# Patient Record
Sex: Female | Born: 1980 | Race: Black or African American | Hispanic: No | Marital: Married | Smoking: Current every day smoker
Health system: Southern US, Community
[De-identification: ages and names within clinical notes are randomized; demographics above are authoritative.]

## PROBLEM LIST (undated history)

## (undated) DIAGNOSIS — I10 Essential (primary) hypertension: Secondary | ICD-10-CM

## (undated) DIAGNOSIS — N83209 Unspecified ovarian cyst, unspecified side: Secondary | ICD-10-CM

## (undated) HISTORY — PX: TUBAL LIGATION: SHX77

---

## 2009-06-11 ENCOUNTER — Emergency Department (HOSPITAL_BASED_OUTPATIENT_CLINIC_OR_DEPARTMENT_OTHER): Admission: EM | Admit: 2009-06-11 | Discharge: 2009-06-11 | Payer: Self-pay | Admitting: Emergency Medicine

## 2009-10-13 ENCOUNTER — Ambulatory Visit: Payer: Self-pay | Admitting: Diagnostic Radiology

## 2009-10-13 ENCOUNTER — Emergency Department (HOSPITAL_BASED_OUTPATIENT_CLINIC_OR_DEPARTMENT_OTHER): Admission: EM | Admit: 2009-10-13 | Discharge: 2009-10-13 | Payer: Self-pay | Admitting: Emergency Medicine

## 2009-10-30 ENCOUNTER — Emergency Department (HOSPITAL_BASED_OUTPATIENT_CLINIC_OR_DEPARTMENT_OTHER): Admission: EM | Admit: 2009-10-30 | Discharge: 2009-10-30 | Payer: Self-pay | Admitting: Emergency Medicine

## 2009-10-30 ENCOUNTER — Ambulatory Visit: Payer: Self-pay | Admitting: Diagnostic Radiology

## 2010-04-11 LAB — URINALYSIS, ROUTINE W REFLEX MICROSCOPIC
Glucose, UA: NEGATIVE mg/dL
Hgb urine dipstick: NEGATIVE
Ketones, ur: NEGATIVE mg/dL
Nitrite: NEGATIVE
Protein, ur: NEGATIVE mg/dL
Specific Gravity, Urine: 1.031 — ABNORMAL HIGH (ref 1.005–1.030)
Urobilinogen, UA: 1 mg/dL (ref 0.0–1.0)
pH: 5.5 (ref 5.0–8.0)

## 2010-04-11 LAB — URINE MICROSCOPIC-ADD ON

## 2010-04-11 LAB — WET PREP, GENITAL: Yeast Wet Prep HPF POC: NONE SEEN

## 2010-04-11 LAB — PREGNANCY, URINE: Preg Test, Ur: NEGATIVE

## 2010-04-11 LAB — GC/CHLAMYDIA PROBE AMP, GENITAL
Chlamydia, DNA Probe: POSITIVE — AB
GC Probe Amp, Genital: NEGATIVE

## 2011-11-07 ENCOUNTER — Encounter (HOSPITAL_BASED_OUTPATIENT_CLINIC_OR_DEPARTMENT_OTHER): Payer: Self-pay | Admitting: *Deleted

## 2011-11-07 ENCOUNTER — Emergency Department (HOSPITAL_BASED_OUTPATIENT_CLINIC_OR_DEPARTMENT_OTHER)
Admission: EM | Admit: 2011-11-07 | Discharge: 2011-11-08 | Disposition: A | Discharge status: Home / Self Care | Payer: Self-pay | Attending: Emergency Medicine | Admitting: Emergency Medicine

## 2011-11-07 DIAGNOSIS — L259 Unspecified contact dermatitis, unspecified cause: Secondary | ICD-10-CM | POA: Insufficient documentation

## 2011-11-07 DIAGNOSIS — F17200 Nicotine dependence, unspecified, uncomplicated: Secondary | ICD-10-CM | POA: Insufficient documentation

## 2011-11-07 DIAGNOSIS — I10 Essential (primary) hypertension: Secondary | ICD-10-CM | POA: Insufficient documentation

## 2011-11-07 DIAGNOSIS — L0100 Impetigo, unspecified: Secondary | ICD-10-CM | POA: Insufficient documentation

## 2011-11-07 HISTORY — DX: Essential (primary) hypertension: I10

## 2011-11-07 MED ORDER — CEPHALEXIN 500 MG PO CAPS: 500.0000 mg | ORAL_CAPSULE | Freq: Four times a day (QID) | ORAL | Status: DC

## 2011-11-07 MED ORDER — PREDNISONE 20 MG PO TABS: 40.0000 mg | ORAL_TABLET | Freq: Every day | ORAL | Status: DC

## 2011-11-07 NOTE — ED Provider Notes (Signed)
History     CSN: [id number]  Arrival date & time 11/07/11  2231   First MD Initiated Contact with Patient 11/07/11 2318      Chief Complaint  Patient presents with  . Rash    (Consider location/radiation/quality/duration/timing/severity/associated sxs/prior treatment) Patient is a 31 y.o. female presenting with rash. The history is provided by the patient.  Rash  This is a new problem. Episode onset: 2 and half weeks ago. The problem has been gradually worsening. The problem is associated with chemical exposure (She had used of relaxer on her hair and an African pride hair dye). There has been no fever. The rash is present on the scalp, face, right ear and neck (chest). The pain is at a severity of 2/10. The pain is mild. The pain has been worsening since onset. Associated symptoms include itching, pain and weeping. She has tried antihistamines and steriods (took steroids last week and did not help and now worse) for the symptoms. The treatment provided no relief.    Past Medical History  Diagnosis Date  . Hypertension     History reviewed. No pertinent past surgical history.  No family history on file.  History  Substance Use Topics  . Smoking status: Current Every Day Smoker -- 0.5 packs/day    Types: Cigarettes  . Smokeless tobacco: Not on file  . Alcohol Use: Yes    OB History    Grav Para Term Preterm Abortions TAB SAB Ect Mult Living                  Review of Systems  Constitutional: Negative for fever.  Skin: Positive for itching and rash.  All other systems reviewed and are negative.    Allergies  Review of patient's allergies indicates no known allergies.  Home Medications  No current outpatient prescriptions on file.  BP 146/85  Pulse 88  Temp 98.3 F (36.8 C) (Oral)  Resp 20  SpO2 100%  Physical Exam  Nursing note and vitals reviewed. Constitutional: She is oriented to person, place, and time.  HENT:  Head: Normocephalic.    Right  Ear: There is drainage and swelling.  Ears:  Mouth/Throat: Oropharynx is clear and moist.       All marked areas with honey crusting, weeping, macular/papular rash  Eyes: EOM are normal. Pupils are equal, round, and reactive to light.  Neurological: She is alert and oriented to person, place, and time.  Skin: Rash noted. Rash is maculopapular and pustular. There is erythema.  Psychiatric: She has a normal mood and affect. Her behavior is normal.    ED Course  Procedures (including critical care time)  Labs Reviewed - No data to display No results found.   1. Contact dermatitis   2. Impetigo       MDM   Patient with a rash that appears to be a contact dermatitis most likely from her relax her or dye product she placed in her here 3 weeks ago. Now has become secondarily infected with some type of staph or strep species. With honeycombing crusting over the years, the back of the neck and the side of her face. He should stay she was seen last week for this and was given steroids but her symptoms did not improve but of only gotten worse. She was placed on Keflex and prednisone. She was encouraged to not use any other product on her hair until all her symptoms had resolved.        Avira Tillison  Anitra Lauth, MD 11/08/11 1610

## 2011-11-07 NOTE — ED Notes (Signed)
Rash on her neck.  States she has had it for 2 weeks. She was treated at [facility] for contact dermatitis a week ago with Prednisone and Atarax. Now the rash is draining.

## 2013-03-06 ENCOUNTER — Emergency Department (HOSPITAL_BASED_OUTPATIENT_CLINIC_OR_DEPARTMENT_OTHER): Payer: Self-pay

## 2013-03-06 ENCOUNTER — Emergency Department (HOSPITAL_BASED_OUTPATIENT_CLINIC_OR_DEPARTMENT_OTHER)
Admission: EM | Admit: 2013-03-06 | Discharge: 2013-03-06 | Disposition: A | Discharge status: Home / Self Care | Payer: Self-pay | Attending: Emergency Medicine | Admitting: Emergency Medicine

## 2013-03-06 ENCOUNTER — Encounter (HOSPITAL_BASED_OUTPATIENT_CLINIC_OR_DEPARTMENT_OTHER): Payer: Self-pay | Admitting: Emergency Medicine

## 2013-03-06 DIAGNOSIS — N390 Urinary tract infection, site not specified: Secondary | ICD-10-CM | POA: Diagnosis present

## 2013-03-06 DIAGNOSIS — I10 Essential (primary) hypertension: Secondary | ICD-10-CM | POA: Insufficient documentation

## 2013-03-06 DIAGNOSIS — R112 Nausea with vomiting, unspecified: Secondary | ICD-10-CM | POA: Insufficient documentation

## 2013-03-06 DIAGNOSIS — N83202 Unspecified ovarian cyst, left side: Secondary | ICD-10-CM | POA: Diagnosis present

## 2013-03-06 DIAGNOSIS — A599 Trichomoniasis, unspecified: Secondary | ICD-10-CM | POA: Diagnosis present

## 2013-03-06 DIAGNOSIS — Z79899 Other long term (current) drug therapy: Secondary | ICD-10-CM | POA: Insufficient documentation

## 2013-03-06 DIAGNOSIS — F17200 Nicotine dependence, unspecified, uncomplicated: Secondary | ICD-10-CM | POA: Insufficient documentation

## 2013-03-06 DIAGNOSIS — IMO0002 Reserved for concepts with insufficient information to code with codable children: Secondary | ICD-10-CM | POA: Insufficient documentation

## 2013-03-06 DIAGNOSIS — Z3202 Encounter for pregnancy test, result negative: Secondary | ICD-10-CM | POA: Insufficient documentation

## 2013-03-06 DIAGNOSIS — N83209 Unspecified ovarian cyst, unspecified side: Secondary | ICD-10-CM | POA: Insufficient documentation

## 2013-03-06 LAB — CBC WITH DIFFERENTIAL/PLATELET
Basophils Absolute: 0 10*3/uL (ref 0.0–0.1)
Basophils Relative: 0 % (ref 0–1)
Eosinophils Absolute: 0.2 10*3/uL (ref 0.0–0.7)
Eosinophils Relative: 4 % (ref 0–5)
HCT: 41.6 % (ref 36.0–46.0)
Hemoglobin: 13.8 g/dL (ref 12.0–15.0)
Lymphocytes Relative: 38 % (ref 12–46)
Lymphs Abs: 2 10*3/uL (ref 0.7–4.0)
MCH: 30 pg (ref 26.0–34.0)
MCHC: 33.2 g/dL (ref 30.0–36.0)
MCV: 90.4 fL (ref 78.0–100.0)
Monocytes Absolute: 0.5 10*3/uL (ref 0.1–1.0)
Monocytes Relative: 9 % (ref 3–12)
Neutro Abs: 2.6 10*3/uL (ref 1.7–7.7)
Neutrophils Relative %: 49 % (ref 43–77)
Platelets: 241 10*3/uL (ref 150–400)
RBC: 4.6 MIL/uL (ref 3.87–5.11)
RDW: 13.5 % (ref 11.5–15.5)
WBC: 5.3 10*3/uL (ref 4.0–10.5)

## 2013-03-06 LAB — COMPREHENSIVE METABOLIC PANEL
ALT: 14 U/L (ref 0–35)
AST: 17 U/L (ref 0–37)
Albumin: 3.7 g/dL (ref 3.5–5.2)
Alkaline Phosphatase: 53 U/L (ref 39–117)
BUN: 9 mg/dL (ref 6–23)
CO2: 29 mEq/L (ref 19–32)
Calcium: 9.4 mg/dL (ref 8.4–10.5)
Chloride: 103 mEq/L (ref 96–112)
Creatinine, Ser: 0.7 mg/dL (ref 0.50–1.10)
GFR calc Af Amer: >90 mL/min (ref >90)
GFR calc non Af Amer: >90 mL/min (ref >90)
Glucose, Bld: 86 mg/dL (ref 70–99)
Potassium: 4.2 mEq/L (ref 3.7–5.3)
Sodium: 143 mEq/L (ref 137–147)
Total Bilirubin: <0.2 mg/dL — ABNORMAL LOW (ref 0.3–1.2)
Total Protein: 7.2 g/dL (ref 6.0–8.3)

## 2013-03-06 LAB — URINALYSIS, ROUTINE W REFLEX MICROSCOPIC
Bilirubin Urine: NEGATIVE
Glucose, UA: NEGATIVE mg/dL
Hgb urine dipstick: NEGATIVE
Ketones, ur: NEGATIVE mg/dL
Nitrite: NEGATIVE
Protein, ur: NEGATIVE mg/dL
Specific Gravity, Urine: 1.017 (ref 1.005–1.030)
Urobilinogen, UA: 0.2 mg/dL (ref 0.0–1.0)
pH: 8 (ref 5.0–8.0)

## 2013-03-06 LAB — WET PREP, GENITAL: Yeast Wet Prep HPF POC: NONE SEEN

## 2013-03-06 LAB — URINE MICROSCOPIC-ADD ON

## 2013-03-06 LAB — PREGNANCY, URINE: Preg Test, Ur: NEGATIVE

## 2013-03-06 MED ORDER — CEFTRIAXONE SODIUM 250 MG IJ SOLR
250.0000 mg | Freq: Once | INTRAMUSCULAR | Status: AC
  Administered 2013-03-06: 250 mg via INTRAMUSCULAR
  Filled 2013-03-06: qty 250

## 2013-03-06 MED ORDER — AZITHROMYCIN 250 MG PO TABS
1000.0000 mg | ORAL_TABLET | Freq: Once | ORAL | Status: AC
  Administered 2013-03-06: 1000 mg via ORAL
  Filled 2013-03-06: qty 4

## 2013-03-06 MED ORDER — OXYCODONE-ACETAMINOPHEN 5-325 MG PO TABS: 1.0000 | ORAL_TABLET | Freq: Four times a day (QID) | ORAL | Status: DC | PRN

## 2013-03-06 MED ORDER — ONDANSETRON HCL 4 MG/2ML IJ SOLN
4.0000 mg | Freq: Once | INTRAMUSCULAR | Status: AC
  Administered 2013-03-06: 4 mg via INTRAVENOUS
  Filled 2013-03-06: qty 2

## 2013-03-06 MED ORDER — OXYCODONE-ACETAMINOPHEN 5-325 MG PO TABS
1.0000 | ORAL_TABLET | Freq: Once | ORAL | Status: AC
  Administered 2013-03-06: 1 via ORAL
  Filled 2013-03-06: qty 1

## 2013-03-06 MED ORDER — CIPROFLOXACIN HCL 250 MG PO TABS: 250.0000 mg | ORAL_TABLET | Freq: Two times a day (BID) | ORAL | Status: AC

## 2013-03-06 MED ORDER — SODIUM CHLORIDE 0.9 % IV BOLUS (SEPSIS)
1000.0000 mL | INTRAVENOUS | Status: AC
  Administered 2013-03-06: 1000 mL via INTRAVENOUS

## 2013-03-06 MED ORDER — METRONIDAZOLE 500 MG PO TABS
2000.0000 mg | ORAL_TABLET | ORAL | Status: AC
  Administered 2013-03-06: 2000 mg via ORAL
  Filled 2013-03-06: qty 4

## 2013-03-06 NOTE — ED Notes (Signed)
Patient transported to Ultrasound 

## 2013-03-06 NOTE — ED Provider Notes (Signed)
CSN: [id number]     Arrival date & time 03/06/13  1114 History   First MD Initiated Contact with Patient 03/06/13 1130     Chief Complaint  Patient presents with  . Abdominal Pain     (Consider location/radiation/quality/duration/timing/severity/associated sxs/prior Treatment) Patient is a 33 y.o. female presenting with abdominal pain. The history is provided by the patient.  Abdominal Pain Pain location:  LLQ Pain quality comment:  Sharp Pain radiates to:  Does not radiate Pain severity:  Moderate Onset quality:  Sudden Duration:  6 hours Timing:  Constant Progression:  Improving Chronicity:  New Context comment:  While asleep Relieved by:  Nothing Worsened by:  Nothing tried Ineffective treatments:  None tried Associated symptoms: nausea and vomiting   Associated symptoms: no chest pain, no cough, no diarrhea, no dysuria, no fatigue, no fever, no hematuria and no shortness of breath     Past Medical History  Diagnosis Date  . Hypertension    Past Surgical History  Procedure Laterality Date  . Tubal ligation     History reviewed. No pertinent family history. History  Substance Use Topics  . Smoking status: Current Every Day Smoker -- 0.50 packs/day    Types: Cigarettes  . Smokeless tobacco: Not on file  . Alcohol Use: Yes   OB History   Grav Para Term Preterm Abortions TAB SAB Ect Mult Living                 Review of Systems  Constitutional: Negative for fever and fatigue.  HENT: Negative for congestion and drooling.   Eyes: Negative for pain.  Respiratory: Negative for cough and shortness of breath.   Cardiovascular: Negative for chest pain.  Gastrointestinal: Positive for nausea, vomiting and abdominal pain. Negative for diarrhea.  Genitourinary: Negative for dysuria and hematuria.  Musculoskeletal: Negative for back pain, gait problem and neck pain.  Skin: Negative for color change.  Neurological: Negative for dizziness and headaches.  Hematological:  Negative for adenopathy.  Psychiatric/Behavioral: Negative for behavioral problems.  All other systems reviewed and are negative.      Allergies  Review of patient's allergies indicates no known allergies.  Home Medications   Current Outpatient Rx  Name  Route  Sig  Dispense  Refill  . cephALEXin (KEFLEX) 500 MG capsule   Oral   Take 1 capsule (500 mg total) by mouth 4 (four) times daily.   40 capsule   0   . predniSONE (DELTASONE) 20 MG tablet   Oral   Take 2 tablets (40 mg total) by mouth daily.   14 tablet   0    LMP 02/18/2013 Physical Exam  Nursing note and vitals reviewed. Constitutional: She is oriented to person, place, and time. She appears well-developed and well-nourished.  HENT:  Head: Normocephalic.  Mouth/Throat: Oropharynx is clear and moist. No oropharyngeal exudate.  Eyes: Conjunctivae and EOM are normal. Pupils are equal, round, and reactive to light.  Neck: Normal range of motion. Neck supple.  Cardiovascular: Normal rate, regular rhythm, normal heart sounds and intact distal pulses.  Exam reveals no gallop and no friction rub.   No murmur heard. Pulmonary/Chest: Effort normal and breath sounds normal. No respiratory distress. She has no wheezes.  Abdominal: Soft. Bowel sounds are normal. There is tenderness (mild tenderness to palpation of the left lower quadrant.). There is no rebound and no guarding.  Genitourinary:  Normal appearing external vagina.  Mild amount of white fluid in vaginal canal and posterior fornix. Normal-appearing  cervix. Os closed.  No cervical motion tenderness. Mild tenderness to palpation during the bimanual in the middle and left lower quadrant.  Musculoskeletal: Normal range of motion. She exhibits no edema and no tenderness.  Neurological: She is alert and oriented to person, place, and time.  Skin: Skin is warm and dry.  Psychiatric: She has a normal mood and affect. Her behavior is normal.    ED Course  Procedures  (including critical care time) Labs Review Labs Reviewed  WET PREP, GENITAL - Abnormal; Notable for the following:    Trich, Wet Prep MODERATE (*)    Clue Cells Wet Prep HPF POC FEW (*)    WBC, Wet Prep HPF POC MANY (*)    All other components within normal limits  COMPREHENSIVE METABOLIC PANEL - Abnormal; Notable for the following:    Total Bilirubin <0.2 (*)    All other components within normal limits  URINALYSIS, ROUTINE W REFLEX MICROSCOPIC - Abnormal; Notable for the following:    Leukocytes, UA SMALL (*)    All other components within normal limits  URINE MICROSCOPIC-ADD ON - Abnormal; Notable for the following:    Bacteria, UA MANY (*)    All other components within normal limits  GC/CHLAMYDIA PROBE AMP  CBC WITH DIFFERENTIAL  PREGNANCY, URINE   Imaging Review US Transvaginal Non-ob  03/06/2013   CLINICAL DATA:  Left lower quadrant pain, nausea, question ovarian torsion or cyst  EXAM: TRANSABDOMINAL AND TRANSVAGINAL ULTRASOUND OF PELVIS  DOPPLER ULTRASOUND OF OVARIES  TECHNIQUE: Both transabdominal and transvaginal ultrasound examinations of the pelvis were performed. Transabdominal technique was performed for global imaging of the pelvis including uterus, ovaries, adnexal regions, and pelvic cul-de-sac.  It was necessary to proceed with endovaginal exam following the transabdominal exam to visualize the endometrium and ovaries. Color and duplex Doppler ultrasound was utilized to evaluate blood flow to the ovaries.  COMPARISON:  None  FINDINGS: Uterus  Measurements: 9.2 x 5.4 x 6.4 cm. Question small leiomyoma 12 x 12 x 7 mm at posterior upper to mid uterus. No additional mass lesion.  Endometrium  Thickness: 15 mm thick, borderline prominent. Question minimal endocervical fluid. No definite focal mass.  Right ovary  Measurements: 2.2 x 2.6 x 2.7 cm. Dominant follicle cyst. No additional mass. Internal blood flow present on color Doppler imaging.  Left ovary  Measurements: 2.7 x 1.9 x  2.4 cm. Question hypoechoic nodule 17 x 14 x 15 mm, could represent a small hemorrhagic follicle cyst. No additional mass lesions.  Pulsed Doppler evaluation of both ovaries demonstrates normal low-resistance arterial and venous waveforms.  Other findings  No free pelvic fluid or adnexal masses.  IMPRESSION: Probable tiny uterine leiomyoma 12 mm greatest size.  Suspect small hemorrhagic follicle cysts left ovary 17 mm greatest size.  Otherwise unremarkable exam.   Electronically Signed   By: Ulyses Southward M.D.   On: 03/06/2013 13:58   US Pelvis Complete  03/06/2013   CLINICAL DATA:  Left lower quadrant pain, nausea, question ovarian torsion or cyst  EXAM: TRANSABDOMINAL AND TRANSVAGINAL ULTRASOUND OF PELVIS  DOPPLER ULTRASOUND OF OVARIES  TECHNIQUE: Both transabdominal and transvaginal ultrasound examinations of the pelvis were performed. Transabdominal technique was performed for global imaging of the pelvis including uterus, ovaries, adnexal regions, and pelvic cul-de-sac.  It was necessary to proceed with endovaginal exam following the transabdominal exam to visualize the endometrium and ovaries. Color and duplex Doppler ultrasound was utilized to evaluate blood flow to the ovaries.  COMPARISON:  None  FINDINGS: Uterus  Measurements: 9.2 x 5.4 x 6.4 cm. Question small leiomyoma 12 x 12 x 7 mm at posterior upper to mid uterus. No additional mass lesion.  Endometrium  Thickness: 15 mm thick, borderline prominent. Question minimal endocervical fluid. No definite focal mass.  Right ovary  Measurements: 2.2 x 2.6 x 2.7 cm. Dominant follicle cyst. No additional mass. Internal blood flow present on color Doppler imaging.  Left ovary  Measurements: 2.7 x 1.9 x 2.4 cm. Question hypoechoic nodule 17 x 14 x 15 mm, could represent a small hemorrhagic follicle cyst. No additional mass lesions.  Pulsed Doppler evaluation of both ovaries demonstrates normal low-resistance arterial and venous waveforms.  Other findings  No free  pelvic fluid or adnexal masses.  IMPRESSION: Probable tiny uterine leiomyoma 12 mm greatest size.  Suspect small hemorrhagic follicle cysts left ovary 17 mm greatest size.  Otherwise unremarkable exam.   Electronically Signed   By: Ulyses Southward M.D.   On: 03/06/2013 13:58   [place] Art/ven Flow Abd Pelv Doppler  03/06/2013   CLINICAL DATA:  Left lower quadrant pain, nausea, question ovarian torsion or cyst  EXAM: TRANSABDOMINAL AND TRANSVAGINAL ULTRASOUND OF PELVIS  DOPPLER ULTRASOUND OF OVARIES  TECHNIQUE: Both transabdominal and transvaginal ultrasound examinations of the pelvis were performed. Transabdominal technique was performed for global imaging of the pelvis including uterus, ovaries, adnexal regions, and pelvic cul-de-sac.  It was necessary to proceed with endovaginal exam following the transabdominal exam to visualize the endometrium and ovaries. Color and duplex Doppler ultrasound was utilized to evaluate blood flow to the ovaries.  COMPARISON:  None  FINDINGS: Uterus  Measurements: 9.2 x 5.4 x 6.4 cm. Question small leiomyoma 12 x 12 x 7 mm at posterior upper to mid uterus. No additional mass lesion.  Endometrium  Thickness: 15 mm thick, borderline prominent. Question minimal endocervical fluid. No definite focal mass.  Right ovary  Measurements: 2.2 x 2.6 x 2.7 cm. Dominant follicle cyst. No additional mass. Internal blood flow present on color Doppler imaging.  Left ovary  Measurements: 2.7 x 1.9 x 2.4 cm. Question hypoechoic nodule 17 x 14 x 15 mm, could represent a small hemorrhagic follicle cyst. No additional mass lesions.  Pulsed Doppler evaluation of both ovaries demonstrates normal low-resistance arterial and venous waveforms.  Other findings  No free pelvic fluid or adnexal masses.  IMPRESSION: Probable tiny uterine leiomyoma 12 mm greatest size.  Suspect small hemorrhagic follicle cysts left ovary 17 mm greatest size.  Otherwise unremarkable exam.   Electronically Signed   By: Ulyses Southward M.D.    On: 03/06/2013 13:58    EKG Interpretation   None       MDM   Final diagnoses:  UTI (lower urinary tract infection)  Trichimoniasis  Left ovarian cyst    11:53 AM 33 y.o. female who presents with sudden onset left lower quadrant abdominal pain which began this morning at approximately 5 AM. The patient also notes intermittent nausea and vomiting for the last 2 weeks. She is afebrile and vital signs are unremarkable here. Mild tenderness to palpation of the left lower quadrant on exam. Hx of gonorrhea, chlamydia, trich. Currently sexually active. Will get screening labwork, perform pelvic, and pelvic ultrasound.  Wet prep shows trich. Will tx empirically w/ rocephin/azithro/flagyl. Informed pt that her partner needs treated as well.   2:37 PM: US showing left sided hemorrhagic cyst. UA suspicious for UTI given small leuk, many bacteria, few epithelial cells. Pt does not  want treated for BV. I think this is reasonable given only few clue cells. Will tx for UTI. Pt feeling better. Pain likely d/t hemorrhagic cyst.  I have discussed the diagnosis/risks/treatment options with the patient and believe the pt to be eligible for discharge home to follow-up with pcp as needed. We also discussed returning to the ED immediately if new or worsening sx occur. We discussed the sx which are most concerning (e.g., worsening pain, fever, vomiting) that necessitate immediate return. Medications administered to the patient during their visit and any new prescriptions provided to the patient are listed below.  Medications given during this visit Medications  sodium chloride 0.9 % bolus 1,000 mL (0 mLs Intravenous Stopped 03/06/13 1300)  ondansetron (ZOFRAN) injection 4 mg (4 mg Intravenous Given 03/06/13 1216)  cefTRIAXone (ROCEPHIN) injection 250 mg (250 mg Intramuscular Given 03/06/13 1345)  metroNIDAZOLE (FLAGYL) tablet 2,000 mg (2,000 mg Oral Given 03/06/13 1345)  azithromycin (ZITHROMAX) tablet 1,000 mg  (1,000 mg Oral Given 03/06/13 1345)  oxyCODONE-acetaminophen (PERCOCET/ROXICET) 5-325 MG per tablet 1 tablet (1 tablet Oral Given 03/06/13 1345)    New Prescriptions   CIPROFLOXACIN (CIPRO) 250 MG TABLET    Take 1 tablet (250 mg total) by mouth every 12 (twelve) hours.   OXYCODONE-ACETAMINOPHEN (PERCOCET) 5-325 MG PER TABLET    Take 1 tablet by mouth every 6 (six) hours as needed for moderate pain.     Junius ArgyleForrest S Florance Paolillo, MD 03/06/13 [phone]

## 2013-03-06 NOTE — ED Notes (Signed)
Pt amb to room 7 with quick steady gait in nad. Pt reports sudden onset of llq pain at 5am. Pt states the pain is sharp and shooting "like someone is stabbing me in my stomach, or a real severe cramp..  Pt reports lmp 1/27, and denies any unusual bleeding or discharge. Reports some nausea, but no vomiting, diarrhea, constipation or fevers.

## 2013-03-06 NOTE — ED Notes (Signed)
MD at bedside. 

## 2013-03-07 LAB — GC/CHLAMYDIA PROBE AMP
CT Probe RNA: NEGATIVE
GC Probe RNA: NEGATIVE

## 2015-04-14 IMAGING — US US TRANSVAGINAL NON-OB
1 series · 13 of 25 positions shown · non-contrast
Comparison: None

CLINICAL DATA: Left lower quadrant pain, nausea, question ovarian
torsion or cyst

EXAM:
TRANSABDOMINAL AND TRANSVAGINAL ULTRASOUND OF PELVIS
DOPPLER ULTRASOUND OF OVARIES
TECHNIQUE: Both transabdominal and transvaginal ultrasound examinations of the
pelvis were performed. Transabdominal technique was performed for
global imaging of the pelvis including uterus, ovaries, adnexal
regions, and pelvic cul-de-sac.
It was necessary to proceed with endovaginal exam following the
transabdominal exam to visualize the endometrium and ovaries. Color
and duplex Doppler ultrasound was utilized to evaluate blood flow to
the ovaries.

[Series 1: us transvaginal non-ob · 0.24mm/px · 13 of 85 slices shown]
[im 1/85]
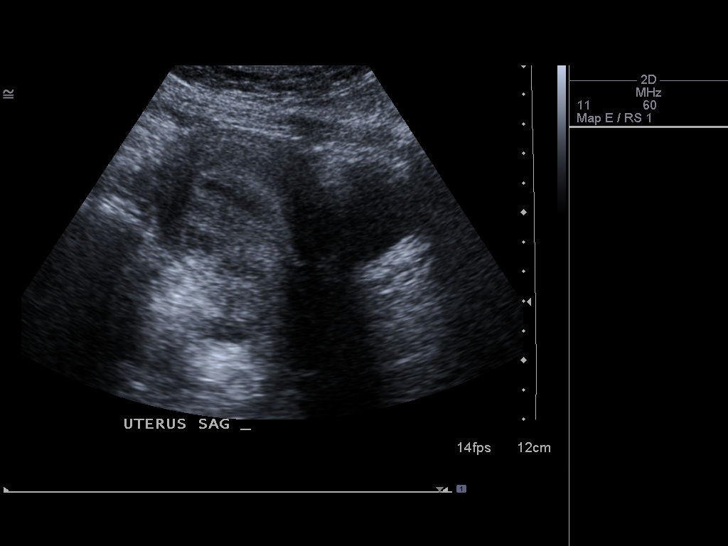
[im 8/85]
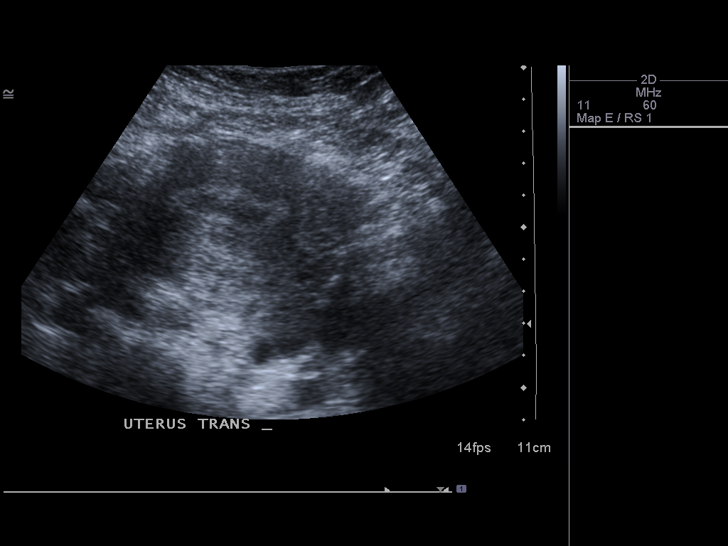
[im 15/85]
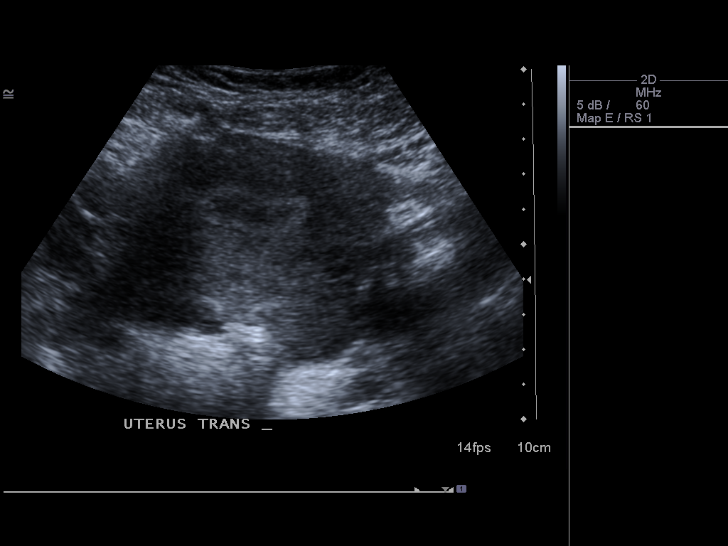
[im 22/85]
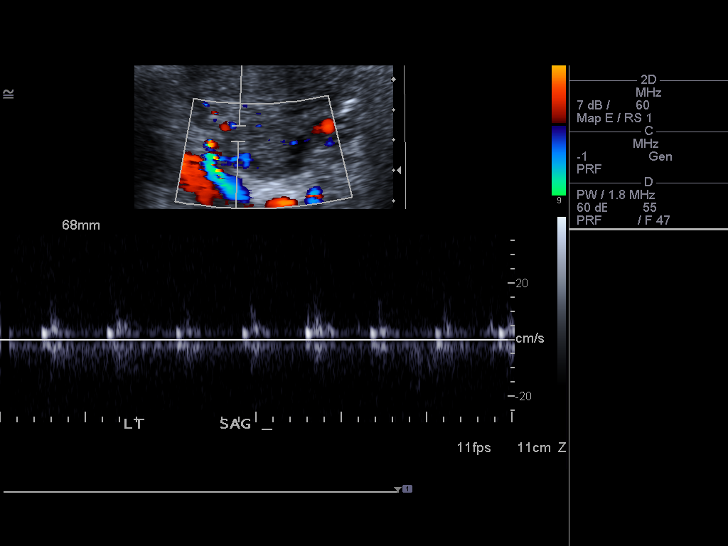
[im 29/85]
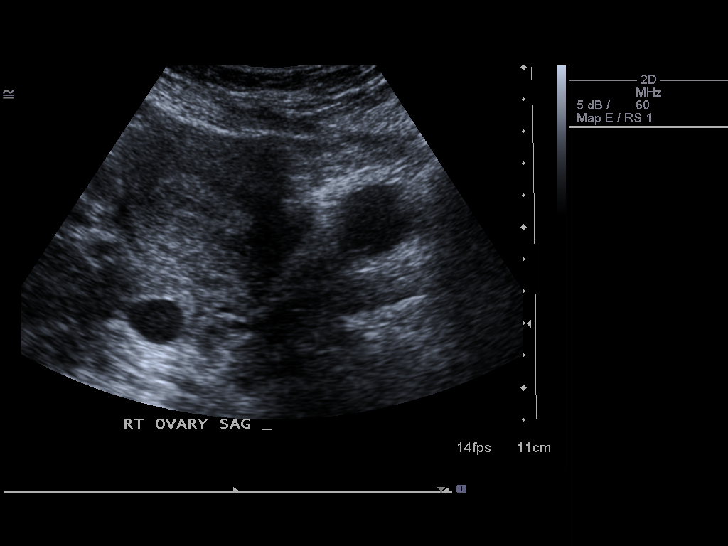
[im 36/85]
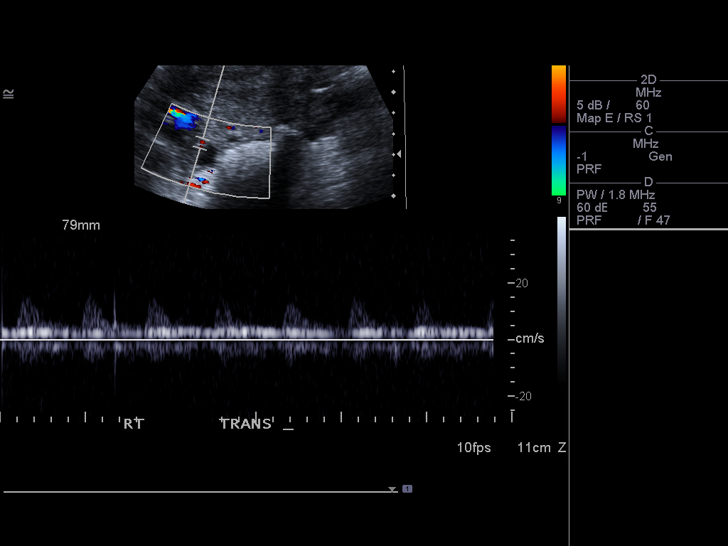
[im 43/85]
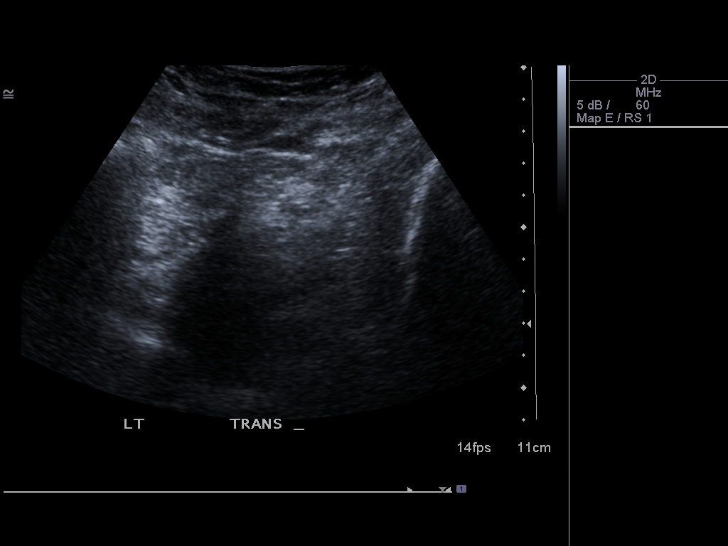
[im 50/85]
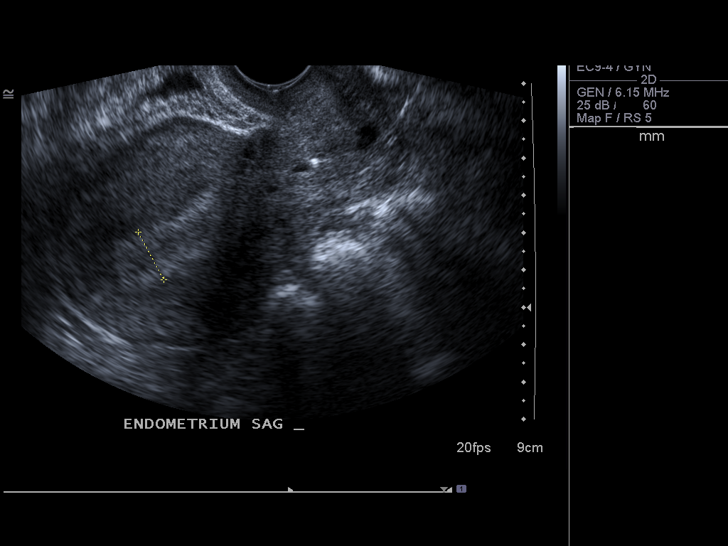
[im 57/85]
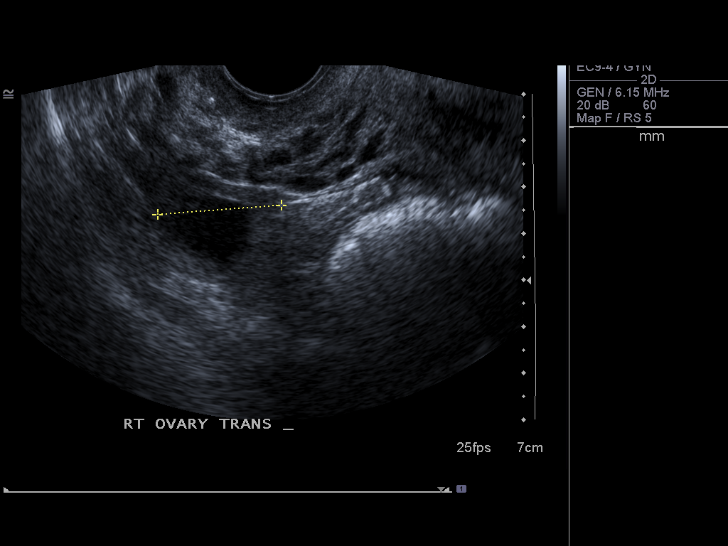
[im 64/85]
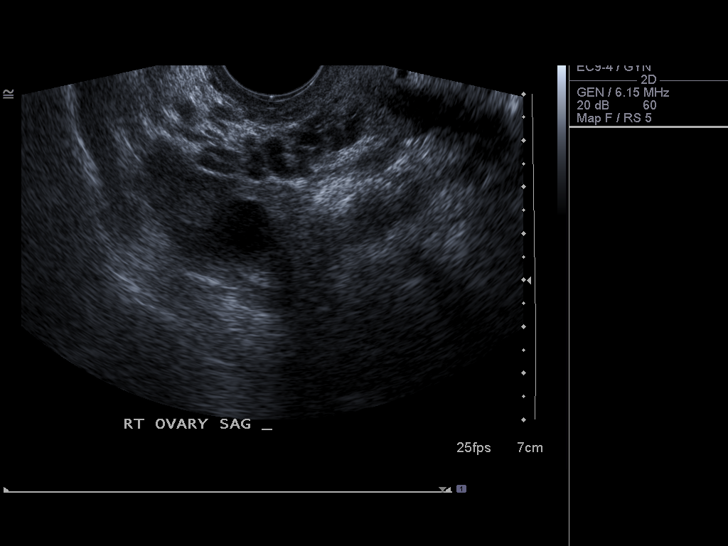
[im 71/85]
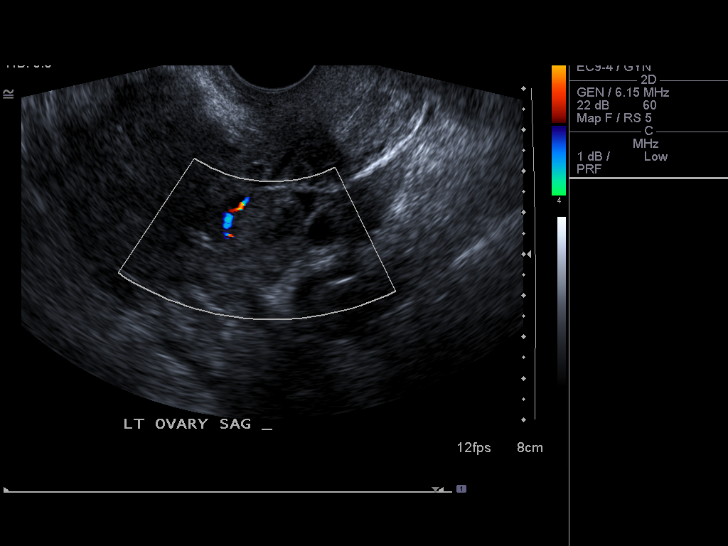
[im 78/85]
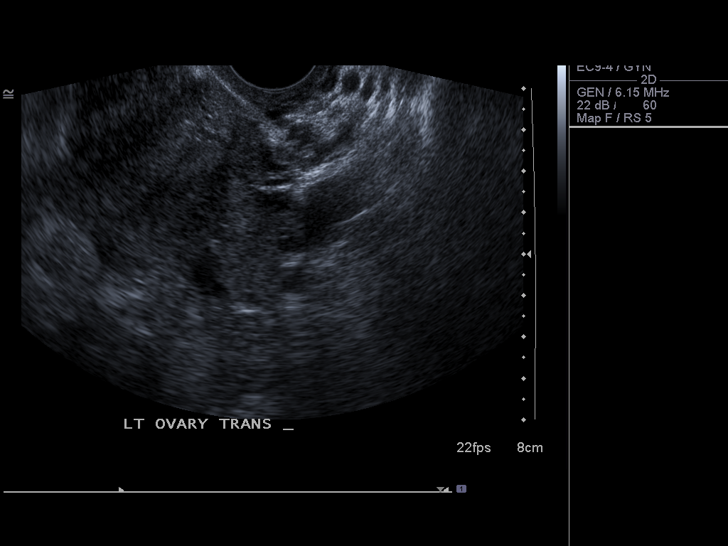
[im 85/85]
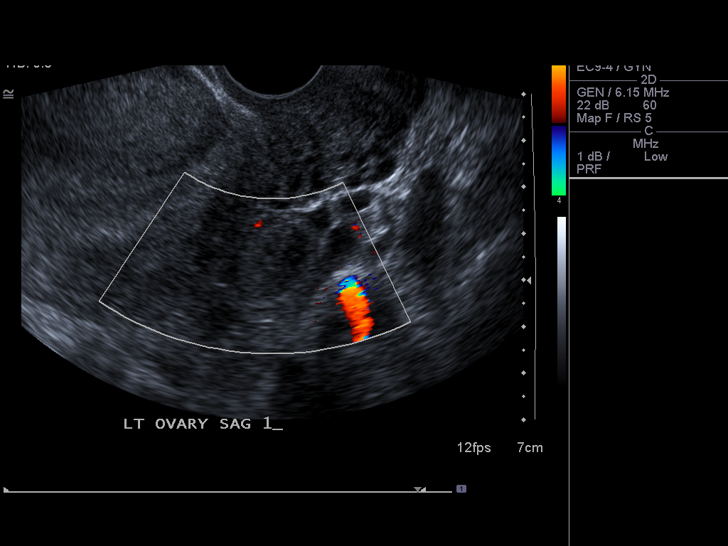

[13 of 25 positions shown; findings below may reference images not displayed]

FINDINGS: Uterus

Measurements: 9.2 x 5.4 x 6.4 cm. Question small leiomyoma 12 x 12 x
7 mm at posterior upper to mid uterus. No additional mass lesion.

Endometrium

Thickness: 15 mm thick, borderline prominent.. Question minimal
endocervical fluid. No definite focal mass.

Right ovary

Measurements: 2.2 x 2.6 x 2.7 cm. Dominant follicle cyst. No
additional mass. Internal blood flow present on color Doppler
imaging.

Left ovary

Measurements: 2.7 x 1.9 x 2.4 cm. Question hypoechoic nodule 17 x 14
x 15 mm, could represent a small hemorrhagic follicle cyst. No
additional mass lesions.

Pulsed Doppler evaluation of both ovaries demonstrates normal
low-resistance arterial and venous waveforms.

Other findings

No free pelvic fluid or adnexal masses.
IMPRESSION: Probable tiny uterine leiomyoma 12 mm greatest size.

Suspect small hemorrhagic follicle cysts left ovary 17 mm greatest
size.

Otherwise unremarkable exam.

## 2016-02-21 ENCOUNTER — Emergency Department (HOSPITAL_BASED_OUTPATIENT_CLINIC_OR_DEPARTMENT_OTHER)
Admission: EM | Admit: 2016-02-21 | Discharge: 2016-02-21 | Disposition: A | Discharge status: Home / Self Care | Payer: Self-pay | Attending: Emergency Medicine | Admitting: Emergency Medicine

## 2016-02-21 ENCOUNTER — Encounter (HOSPITAL_BASED_OUTPATIENT_CLINIC_OR_DEPARTMENT_OTHER): Payer: Self-pay | Admitting: *Deleted

## 2016-02-21 DIAGNOSIS — R69 Illness, unspecified: Secondary | ICD-10-CM

## 2016-02-21 DIAGNOSIS — F17210 Nicotine dependence, cigarettes, uncomplicated: Secondary | ICD-10-CM | POA: Insufficient documentation

## 2016-02-21 DIAGNOSIS — Z79899 Other long term (current) drug therapy: Secondary | ICD-10-CM | POA: Insufficient documentation

## 2016-02-21 DIAGNOSIS — J111 Influenza due to unidentified influenza virus with other respiratory manifestations: Secondary | ICD-10-CM | POA: Insufficient documentation

## 2016-02-21 DIAGNOSIS — I10 Essential (primary) hypertension: Secondary | ICD-10-CM | POA: Insufficient documentation

## 2016-02-21 LAB — RAPID STREP SCREEN (MED CTR MEBANE ONLY): Streptococcus, Group A Screen (Direct): NEGATIVE

## 2016-02-21 MED ORDER — OSELTAMIVIR PHOSPHATE 75 MG PO CAPS: 75.0000 mg | ORAL_CAPSULE | Freq: Two times a day (BID) | ORAL | 0 refills | Status: DC

## 2016-02-21 NOTE — ED Triage Notes (Signed)
Body aches, sore throat, cough since yesterday.

## 2016-02-21 NOTE — ED Provider Notes (Signed)
[facility] Note   CSN: [id number] Arrival date & time: 02/21/16  1724 By signing my name below, I, Bridgette Habermann, attest that this documentation has been prepared under the direction and in the presence of Langston Masker, [place]. Electronically Signed: Bridgette Habermann, ED Scribe. 02/21/16. 7:47 PM.  History   Chief Complaint Chief Complaint  Patient presents with  . Cough  . Sore Throat    HPI The history is provided by the patient. No language interpreter was used.   HPI Comments: Desiree Sweeney is a 36 y.o. female with h/o HTN, who presents to the Emergency Department complaining of cough onset yesterday with associated generalized arthralgias/myalgias, headache, and sore throat. She has not tried any OTC medications PTA. Pt did not get a flu shot this season. No known sick contacts with similar symptoms. Pt denies fever, chills, or any other associated symptoms.   Past Medical History:  Diagnosis Date  . Hypertension     Patient Active Problem List   Diagnosis Date Noted  . Left ovarian cyst 03/06/2013  . Trichimoniasis 03/06/2013  . UTI (lower urinary tract infection) 03/06/2013    Past Surgical History:  Procedure Laterality Date  . TUBAL LIGATION      OB History    No data available       Home Medications    Prior to Admission medications   Medication Sig Start Date End Date Taking? Authorizing Provider  cephALEXin (KEFLEX) 500 MG capsule Take 1 capsule (500 mg total) by mouth 4 (four) times daily. 11/07/11   Gwyneth Sprout, MD  oxyCODONE-acetaminophen (PERCOCET) 5-325 MG per tablet Take 1 tablet by mouth every 6 (six) hours as needed for moderate pain. 03/06/13   Purvis Sheffield, MD  predniSONE (DELTASONE) 20 MG tablet Take 2 tablets (40 mg total) by mouth daily. 11/07/11   Gwyneth Sprout, MD    Family History No family history on file.  Social History Social History  Substance Use Topics  . Smoking status: Current Every Day Smoker   Packs/day: 0.50    Types: Cigarettes  . Smokeless tobacco: Never Used  . Alcohol use Yes     Allergies   Patient has no known allergies.   Review of Systems Review of Systems  Constitutional: Negative for chills and fever.  HENT: Positive for sore throat.   Respiratory: Positive for cough.   Musculoskeletal: Positive for arthralgias and myalgias.  Neurological: Positive for headaches.  All other systems reviewed and are negative.  Physical Exam Updated Vital Signs BP 136/91   Pulse 95   Temp 98 F (36.7 C) (Oral)   Resp 20   Ht 5\' 3"  (1.6 m)   Wt 230 lb (104.3 kg)   LMP 01/24/2016   SpO2 99%   BMI 40.74 kg/m   Physical Exam  Constitutional: She appears well-developed and well-nourished.  HENT:  Head: Normocephalic.  Eyes: Conjunctivae are normal.  Cardiovascular: Normal rate.   Pulmonary/Chest: Effort normal. No respiratory distress.  Abdominal: She exhibits no distension.  Musculoskeletal: Normal range of motion.  Neurological: She is alert.  Skin: Skin is warm and dry.  Psychiatric: She has a normal mood and affect. Her behavior is normal.  Nursing note and vitals reviewed.  ED Treatments / Results  DIAGNOSTIC STUDIES: Oxygen Saturation is 99% on RA, normal by my interpretation.    COORDINATION OF CARE: 7:46 PM Discussed treatment plan with pt at bedside and pt agreed to plan.  Labs (all labs ordered are listed, but only  abnormal results are displayed) Labs Reviewed  RAPID STRE[facility]CREEN (NOT [facility])  CULTURE, GROUP A STREP [facility])    EKG  EKG Interpretation None       Radiology No results found.  Procedures Procedures (including critical care time)  Medications Ordered in ED Medications - No data to display   Initial Impression / Assessment and Plan / ED Course  I have reviewed the triage vital signs and the nursing notes.  Pertinent labs & imaging results that were available during my care of the patient were reviewed by me and  considered in my medical decision making (see chart for details).       Final Clinical Impressions(s) / ED Diagnoses   Final diagnoses:  Influenza  Influenza-like illness    New Prescriptions Discharge Medication List as of 02/21/2016  8:04 PM    START taking these medications   Details  oseltamivir (TAMIFLU) 75 MG capsule Take 1 capsule (75 mg total) by mouth every 12 (twelve) hours., Starting Mon 02/21/2016, Print      An After Visit Summary was printed and given to the patient.  I personally performed the services in this documentation, which was scribed in my presence.  The recorded information has been reviewed and considered.   Barnet PallKaren SofiaPAC.  BP 145/93   Pulse 72   Temp 98 F (36.7 C) (Oral)   Resp 20   Ht 5\' 3"  (1.6 m)   Wt 104.3 kg   LMP 01/24/2016   SpO2 100%   BMI 40.74 kg/m      Elson AreasLeslie K Sofia, PA-C 02/22/16 0111    Vanetta MuldersScott Zackowski, MD 02/24/16 [phone]

## 2016-02-21 NOTE — Discharge Instructions (Signed)
Return if any problems.

## 2016-02-24 LAB — CULTURE, GROUP A STREP (THRC)

## 2016-07-24 ENCOUNTER — Emergency Department (HOSPITAL_BASED_OUTPATIENT_CLINIC_OR_DEPARTMENT_OTHER)
Admission: EM | Admit: 2016-07-24 | Discharge: 2016-07-24 | Disposition: A | Discharge status: Home / Self Care | Payer: Self-pay | Attending: Emergency Medicine | Admitting: Emergency Medicine

## 2016-07-24 ENCOUNTER — Encounter (HOSPITAL_BASED_OUTPATIENT_CLINIC_OR_DEPARTMENT_OTHER): Payer: Self-pay | Admitting: *Deleted

## 2016-07-24 DIAGNOSIS — I10 Essential (primary) hypertension: Secondary | ICD-10-CM | POA: Insufficient documentation

## 2016-07-24 DIAGNOSIS — Z79899 Other long term (current) drug therapy: Secondary | ICD-10-CM | POA: Insufficient documentation

## 2016-07-24 DIAGNOSIS — F17210 Nicotine dependence, cigarettes, uncomplicated: Secondary | ICD-10-CM | POA: Insufficient documentation

## 2016-07-24 DIAGNOSIS — G43909 Migraine, unspecified, not intractable, without status migrainosus: Secondary | ICD-10-CM | POA: Insufficient documentation

## 2016-07-24 LAB — PREGNANCY, URINE: PREG TEST UR: NEGATIVE

## 2016-07-24 MED ORDER — DIPHENHYDRAMINE HCL 50 MG/ML IJ SOLN
25.0000 mg | Freq: Once | INTRAMUSCULAR | Status: AC
  Administered 2016-07-24: 25 mg via INTRAVENOUS
  Filled 2016-07-24: qty 1

## 2016-07-24 MED ORDER — KETOROLAC TROMETHAMINE 30 MG/ML IJ SOLN
30.0000 mg | Freq: Once | INTRAMUSCULAR | Status: AC
  Administered 2016-07-24: 30 mg via INTRAVENOUS
  Filled 2016-07-24: qty 1

## 2016-07-24 MED ORDER — PROCHLORPERAZINE EDISYLATE 5 MG/ML IJ SOLN
10.0000 mg | Freq: Once | INTRAMUSCULAR | Status: AC
  Administered 2016-07-24: 10 mg via INTRAVENOUS
  Filled 2016-07-24: qty 2

## 2016-07-24 NOTE — ED Notes (Signed)
ED Provider at bedside. 

## 2016-07-24 NOTE — ED Triage Notes (Signed)
Migraine since this am. She took Tylenol, took a nap and headache continues.

## 2016-07-24 NOTE — ED Provider Notes (Signed)
[facility] Provider [facility]   CSN: [id number] Arrival date & time: 07/24/16  1911     History   Chief Complaint Chief Complaint  Patient presents with  . Headache    HPI Desiree Sweeney is a 36 y.o. female.  36yo F w/ h/o HTN who p/w headache. She had a gradual onset of headache this morning, took tylenol at 9:30 am and eventually left work at noon due to persistence of HA. She went home and slept for 4 hours, woke up and the pain was still severe so she came to ED. She reports h/o similar headaches in the past, especially under stress and with poor sleep. She has been under more  stress at work recently and suspects that this is the reason for her headache.  No vomiting, visual changes, problems with walking, fevers, head injury, or recent illness.   The history is provided by the patient.    Past Medical History:  Diagnosis Date  . Hypertension     Patient Active Problem List   Diagnosis Date Noted  . Left ovarian cyst 03/06/2013  . Trichimoniasis 03/06/2013  . UTI (lower urinary tract infection) 03/06/2013    Past Surgical History:  Procedure Laterality Date  . TUBAL LIGATION      OB History    No data available       Home Medications    Prior to Admission medications   Medication Sig Start Date End Date Taking? Authorizing Provider  cephALEXin (KEFLEX) 500 MG capsule Take 1 capsule (500 mg total) by mouth 4 (four) times daily. 11/07/11   Gwyneth SproutPlunkett, Whitney, MD  oseltamivir (TAMIFLU) 75 MG capsule Take 1 capsule (75 mg total) by mouth every 12 (twelve) hours. 02/21/16   Elson AreasSofia, Leslie K, PA-C  oxyCODONE-acetaminophen (PERCOCET) 5-325 MG per tablet Take 1 tablet by mouth every 6 (six) hours as needed for moderate pain. 03/06/13   Purvis SheffieldHarrison, Forrest, MD  predniSONE (DELTASONE) 20 MG tablet Take 2 tablets (40 mg total) by mouth daily. 11/07/11   Gwyneth SproutPlunkett, Whitney, MD    Family History No family history on file.  Social History Social History    Substance Use Topics  . Smoking status: Current Every Day Smoker    Packs/day: 0.50    Types: Cigarettes  . Smokeless tobacco: Never Used  . Alcohol use Yes     Allergies   Patient has no known allergies.   Review of Systems Review of Systems All other systems reviewed and are negative except that which was mentioned in HPI   Physical Exam Updated Vital Signs BP (!) 146/89 (BP Location: Right Arm)   Pulse 68   Temp 98.3 F (36.8 C) (Oral)   Resp 16   Ht 5\' 3"  (1.6 m)   Wt 104.3 kg (230 lb)   LMP 07/19/2016   SpO2 96%   BMI 40.74 kg/m   Physical Exam  Constitutional: She is oriented to person, place, and time. She appears well-developed and well-nourished. No distress.  Awake, alert, resting in dark room under blanket  HENT:  Head: Normocephalic and atraumatic.  Eyes: Conjunctivae and EOM are normal. Pupils are equal, round, and reactive to light.  Neck: Neck supple.  Cardiovascular: Normal rate, regular rhythm and normal heart sounds.   No murmur heard. Pulmonary/Chest: Effort normal and breath sounds normal. No respiratory distress.  Abdominal: Soft. Bowel sounds are normal. She exhibits no distension. There is no tenderness.  Musculoskeletal: She exhibits no edema.  Neurological: She is alert and  oriented to person, place, and time. She has normal reflexes. No cranial nerve deficit. She exhibits normal muscle tone.  Fluent speech, normal finger-to-nose testing, negative pronator drift, no clonus 5/5 strength and normal sensation x all 4 extremities  Skin: Skin is warm and dry.  Psychiatric: She has a normal mood and affect. Judgment and thought content normal.  Nursing note and vitals reviewed.    ED Treatments / Results  Labs (all labs ordered are listed, but only abnormal results are displayed) Labs Reviewed  PREGNANCY, URINE    EKG  EKG Interpretation None       Radiology No results found.  Procedures Procedures (including critical care  time)  Medications Ordered in ED Medications  diphenhydrAMINE (BENADRYL) injection 25 mg (25 mg Intravenous Given 07/24/16 2214)  prochlorperazine (COMPAZINE) injection 10 mg (10 mg Intravenous Given 07/24/16 2213)  ketorolac (TORADOL) 30 MG/ML injection 30 mg (30 mg Intravenous Given 07/24/16 2214)     Initial Impression / Assessment and Plan / ED Course  I have reviewed the triage vital signs and the nursing notes.  Pertinent labs that were available during my care of the patient were reviewed by me and considered in my medical decision making (see chart for details).     Pt w/ Gradual onset of headache, states she's had similar headaches previously. She was neurologically intact on exam with reassuring vital signs. Normal neurologic exam. Gave Toradol, Benadryl, and Compazine. On repeat exam, she stated that her headache was improved and she wanted to go home to sleep. The patient denies any neurologic symptoms such as visual changes, focal numbness/weakness, balance problems, confusion, or speech difficulty to suggest a life-threatening intracranial process such as intracranial hemorrhage or mass. The patient has no clotting risk factors thus venous sinus thrombosis is unlikely.  I feel that the patient is safe for discharge home without any head imaging at this time. Return precautions reviewed and patient discharged in satisfactory condition. Final Clinical Impressions(s) / ED Diagnoses   Final diagnoses:  Migraine without status migrainosus, not intractable, unspecified migraine type    New Prescriptions Discharge Medication List as of 07/24/2016 10:57 PM       Ziva Nunziata, Ambrose Finland, MD 07/24/16 2309

## 2016-07-24 NOTE — ED Notes (Signed)
Family at bedside. 

## 2018-04-15 ENCOUNTER — Other Ambulatory Visit: Payer: Self-pay

## 2018-04-15 ENCOUNTER — Encounter (HOSPITAL_BASED_OUTPATIENT_CLINIC_OR_DEPARTMENT_OTHER): Payer: Self-pay | Admitting: *Deleted

## 2018-04-15 ENCOUNTER — Emergency Department (HOSPITAL_BASED_OUTPATIENT_CLINIC_OR_DEPARTMENT_OTHER)
Admission: EM | Admit: 2018-04-15 | Discharge: 2018-04-15 | Disposition: A | Discharge status: Home / Self Care | Payer: Self-pay | Attending: Emergency Medicine | Admitting: Emergency Medicine

## 2018-04-15 DIAGNOSIS — F17210 Nicotine dependence, cigarettes, uncomplicated: Secondary | ICD-10-CM | POA: Insufficient documentation

## 2018-04-15 DIAGNOSIS — R61 Generalized hyperhidrosis: Secondary | ICD-10-CM | POA: Insufficient documentation

## 2018-04-15 DIAGNOSIS — R05 Cough: Secondary | ICD-10-CM | POA: Insufficient documentation

## 2018-04-15 DIAGNOSIS — M791 Myalgia, unspecified site: Secondary | ICD-10-CM | POA: Insufficient documentation

## 2018-04-15 DIAGNOSIS — J069 Acute upper respiratory infection, unspecified: Secondary | ICD-10-CM | POA: Insufficient documentation

## 2018-04-15 DIAGNOSIS — I10 Essential (primary) hypertension: Secondary | ICD-10-CM | POA: Insufficient documentation

## 2018-04-15 MED ORDER — BENZONATATE 100 MG PO CAPS: 100.0000 mg | ORAL_CAPSULE | Freq: Three times a day (TID) | ORAL | 0 refills | Status: AC

## 2018-04-15 MED ORDER — FLUTICASONE PROPIONATE 50 MCG/ACT NA SUSP: 2.0000 | Freq: Every day | NASAL | 0 refills | Status: AC

## 2018-04-15 MED FILL — FLUTICASONE PROP 50 MCG SPR: 50 | 30 days supply | Qty: 16 | Fill #0

## 2018-04-15 MED FILL — BENZONATATE 100 MG CAP: 100 | 5 days supply | Qty: 15 | Fill #0

## 2018-04-15 NOTE — ED Triage Notes (Signed)
Pt c/o URi symptoms x 4 days 

## 2018-04-15 NOTE — Discharge Instructions (Signed)
You were diagnosed with an upper respiratory infection today. Please take medications as directed. Make sure to stay well hydrated and get plenty of rest.   You should be isolated for at least 7 days since the onset of your symptoms AND >72 hours after symptomsresolution (absence of fever without the use of fever reducing medication and improvement in respiratory symptoms), whichever is longer  Please follow up with your primary care provider within 5-7 days for re-evaluation of your symptoms. If you do not have a primary care provider, information for a healthcare clinic has been provided for you to make arrangements for follow up care. Please return to the emergency department for any new or worsening symptoms.

## 2018-04-15 NOTE — ED Provider Notes (Signed)
[facility] EMERGENCY DEPARTMENT Provider Note   CSN: [id number] Arrival date & time: 04/15/18  1018    History   Chief Complaint Chief Complaint  Patient presents with  . URI    HPI Desiree Sweeney is a 38 y.o. female.     HPI   Patient is a 38 year old female with history of hypertension who presents the emergency department today for evaluation of URI symptoms.  Patient states she began to have fever 4 days ago.  She later developed sweats, chills, body aches and a cough.  Initially cough was productive however she states that her cough has improved and she is not really coughing anymore.  She currently only has some nasal congestion rhinorrhea and watery eyes.  Her fevers have also improved and she did not have a temperature today.  She has been taking Tylenol and aspirin for fevers.  She been taking TheraFlu as well.  She denies chest pain or shortness of breath.  Has had some nausea no vomiting or diarrhea.  Denies sick contacts.  No recent foreign travel or travel to areas with high rates of coronavirus. No known exposures to persons with coronavirus or other upper respiratory illnesses.   Past Medical History:  Diagnosis Date  . Hypertension     Patient Active Problem List   Diagnosis Date Noted  . Left ovarian cyst 03/06/2013  . Trichimoniasis 03/06/2013  . UTI (lower urinary tract infection) 03/06/2013    Past Surgical History:  Procedure Laterality Date  . TUBAL LIGATION       OB History   No obstetric history on file.      Home Medications    Prior to Admission medications   Medication Sig Start Date End Date Taking? Authorizing Provider  aspirin-sod bicarb-citric acid (ALKA-SELTZER) 325 MG TBEF tablet Take 325 mg by mouth every 6 (six) hours as needed.   Yes [provider]  benzonatate (TESSALON) 100 MG capsule Take 1 capsule (100 mg total) by mouth every 8 (eight) hours for 5 days. 04/15/18 04/20/18  Lamanda Rudder S, PA-C   fluticasone (FLONASE) 50 MCG/ACT nasal spray Place 2 sprays into both nostrils daily. 04/15/18   Ares Tegtmeyer S, PA-C    Family History History reviewed. No pertinent family history.  Social History Social History   Tobacco Use  . Smoking status: Current Every Day Smoker    Packs/day: 0.50    Types: Cigarettes  . Smokeless tobacco: Never Used  Substance Use Topics  . Alcohol use: Yes  . Drug use: No     Allergies   Patient has no known allergies.   Review of Systems Review of Systems  Constitutional: Positive for chills (resolved) and fever (improved).  HENT: Positive for congestion and rhinorrhea. Negative for ear pain and sore throat.   Eyes: Negative for visual disturbance.       Watery eyes  Respiratory: Positive for cough (resolved). Negative for shortness of breath.   Cardiovascular: Negative for chest pain and palpitations.  Gastrointestinal: Positive for nausea. Negative for abdominal pain, diarrhea and vomiting.  Genitourinary: Negative for dysuria and hematuria.  Musculoskeletal: Positive for myalgias (resolved).  Skin: Negative for rash.  Neurological: Negative for headaches.  All other systems reviewed and are negative.    Physical Exam Updated Vital Signs BP (!) 170/100 (BP Location: Right Arm)   Pulse 78   Temp 98 F (36.7 C) (Oral)   Resp 18   Ht  (1.6 m)   Wt 119.7 kg  LMP 04/07/2018   SpO2 98%   BMI 46.77 kg/m   Physical Exam Vitals signs and nursing note reviewed.  Constitutional:      General: She is not in acute distress.    Appearance: She is well-developed. She is not ill-appearing or toxic-appearing.  HENT:     Head: Normocephalic and atraumatic.     Right Ear: Tympanic membrane normal.     Left Ear: Tympanic membrane normal.     Nose:     Comments: bilat nasal turbinates swollen    Mouth/Throat:     Pharynx: No oropharyngeal exudate or posterior oropharyngeal erythema.  Eyes:     Conjunctiva/sclera: Conjunctivae  normal.  Neck:     Musculoskeletal: Neck supple.  Cardiovascular:     Rate and Rhythm: Normal rate and regular rhythm.     Heart sounds: Normal heart sounds. No murmur.  Pulmonary:     Effort: Pulmonary effort is normal. No respiratory distress.     Breath sounds: Normal breath sounds. No stridor. No wheezing or rhonchi.  Abdominal:     General: Bowel sounds are normal.     Palpations: Abdomen is soft.     Tenderness: There is no abdominal tenderness. There is no guarding or rebound.  Lymphadenopathy:     Cervical: No cervical adenopathy.  Skin:    General: Skin is warm and dry.  Neurological:     Mental Status: She is alert.      ED Treatments / Results  Labs (all labs ordered are listed, but only abnormal results are displayed) Labs Reviewed - No data to display  EKG None  Radiology No results found.  Procedures Procedures (including critical care time)  Medications Ordered in ED Medications - No data to display   Initial Impression / Assessment and Plan / ED Course  I have reviewed the triage vital signs and the nursing notes.  Pertinent labs & imaging results that were available during my care of the patient were reviewed by me and considered in my medical decision making (see chart for details).     Final Clinical Impressions(s) / ED Diagnoses   Final diagnoses:  Upper respiratory tract infection, unspecified type   Patients symptoms are consistent with URI, likely viral etiology. Her sxs including her cough and fevers have been improving and she states that she does not really have a cough currently. States that she is overall feeling improved and only came to the ED to appease her family who was worried. She is low risk for covid. She does not meet current CDC criteria for testing as she is not getting amitted to the hospital. She is out of the window for flu testing and I discussed this with her. She agrees to forgo testing for this at this time. Discussed  that she should quarantine for 7 days since sx onset and >72 hours of being asymptomatic without meds. She voices understanding and states she does not work this week anyways. Pt will be discharged with symptomatic treatment.  Specific return precautions were discussed for new or worsening symptoms. Verbalizes understanding and is agreeable with plan. Pt is hemodynamically stable & in NAD prior to dc.   ED Discharge Orders         Ordered    benzonatate (TESSALON) 100 MG capsule  Every 8 hours     04/15/18 1047    fluticasone (FLONASE) 50 MCG/ACT nasal spray  Daily     04/15/18 1047  Rayne Du 04/15/18 1056    Little, Ambrose Finland, MD 04/15/18 [phone]

## 2018-10-23 ENCOUNTER — Encounter (HOSPITAL_BASED_OUTPATIENT_CLINIC_OR_DEPARTMENT_OTHER): Payer: Self-pay

## 2018-10-23 ENCOUNTER — Other Ambulatory Visit: Payer: Self-pay

## 2018-10-23 ENCOUNTER — Emergency Department (HOSPITAL_BASED_OUTPATIENT_CLINIC_OR_DEPARTMENT_OTHER): Payer: Self-pay

## 2018-10-23 ENCOUNTER — Emergency Department (HOSPITAL_BASED_OUTPATIENT_CLINIC_OR_DEPARTMENT_OTHER)
Admission: EM | Admit: 2018-10-23 | Discharge: 2018-10-23 | Disposition: A | Discharge status: Home / Self Care | Payer: Self-pay | Attending: Emergency Medicine | Admitting: Emergency Medicine

## 2018-10-23 DIAGNOSIS — I10 Essential (primary) hypertension: Secondary | ICD-10-CM | POA: Insufficient documentation

## 2018-10-23 DIAGNOSIS — Z79899 Other long term (current) drug therapy: Secondary | ICD-10-CM | POA: Insufficient documentation

## 2018-10-23 DIAGNOSIS — M79671 Pain in right foot: Secondary | ICD-10-CM | POA: Insufficient documentation

## 2018-10-23 DIAGNOSIS — F17210 Nicotine dependence, cigarettes, uncomplicated: Secondary | ICD-10-CM | POA: Insufficient documentation

## 2018-10-23 MED ORDER — MELOXICAM 7.5 MG PO TABS: 7.5000 mg | ORAL_TABLET | Freq: Every day | ORAL | 0 refills | Status: AC | PRN

## 2018-10-23 MED FILL — MELOXICAM 7.5 MG TABLET: 7.5 | 5 days supply | Qty: 10 | Fill #0

## 2018-10-23 NOTE — ED Provider Notes (Signed)
[facility] EMERGENCY DEPARTMENT Provider Note   CSN: [id number] Arrival date & time: 10/23/18  1123     History   Chief Complaint Chief Complaint  Patient presents with  . Foot Pain    HPI Desiree Sweeney is a 38 y.o. female with a history of hypertension and tobacco abuse who presents to the emergency department with complaints of right foot pain that began yesterday.  Patient denies any specific traumatic injury but does state that she started a new job where she is on her feet all day.  She has a prior right foot injury from a few years ago that she thinks is flaring back up.  Pain is constant, moderate in severity, worse with movement/weightbearing, no alleviating factors.  Tried taking Aleve without relief.  Denies fever, chills, redness, numbness, tingling, or weakness.Denies pregnancy.      HPI  Past Medical History:  Diagnosis Date  . Hypertension     Patient Active Problem List   Diagnosis Date Noted  . Left ovarian cyst 03/06/2013  . Trichimoniasis 03/06/2013  . UTI (lower urinary tract infection) 03/06/2013    Past Surgical History:  Procedure Laterality Date  . TUBAL LIGATION       OB History   No obstetric history on file.      Home Medications    Prior to Admission medications   Medication Sig Start Date End Date Taking? Authorizing Provider  aspirin-sod bicarb-citric acid (ALKA-SELTZER) 325 MG TBEF tablet Take 325 mg by mouth every 6 (six) hours as needed.    [provider]  fluticasone (FLONASE) 50 MCG/ACT nasal spray Place 2 sprays into both nostrils daily. 04/15/18   Couture, Cortni S, PA-C    Family History No family history on file.  Social History Social History   Tobacco Use  . Smoking status: Current Every Day Smoker    Packs/day: 0.50    Types: Cigarettes  . Smokeless tobacco: Never Used  Substance Use Topics  . Alcohol use: Yes    Comment: occ  . Drug use: No     Allergies   Patient has no known  allergies.   Review of Systems Review of Systems  Constitutional: Negative for chills and fever.  Cardiovascular: Negative for leg swelling.  Musculoskeletal: Positive for arthralgias and myalgias.  Skin: Negative for color change and wound.  Neurological: Negative for weakness and numbness.     Physical Exam Updated Vital Signs BP (!) 148/94 (BP Location: Left Arm)   Pulse 76   Temp 98 F (36.7 C) (Oral)   Resp 18   Ht 5\' 3"  (1.6 m)   Wt 115.7 kg   LMP 10/13/2018   SpO2 97%   BMI 45.17 kg/m   Physical Exam Vitals signs and nursing note reviewed.  Constitutional:      General: She is not in acute distress.    Appearance: She is not ill-appearing or toxic-appearing.  HENT:     Head: Normocephalic and atraumatic.  Cardiovascular:     Pulses:          Dorsalis pedis pulses are 2+ on the right side and 2+ on the left side.       Posterior tibial pulses are 2+ on the right side and 2+ on the left side.  Pulmonary:     Effort: Pulmonary effort is normal.  Musculoskeletal:     Comments: Lower extremities: No obvious deformity, appreciable swelling, edema, erythema, ecchymosis, warmth, or open wounds. Patient has intact AROM to  bilateral hips, knees, ankles, and all digits. Tender to palpation to the right lateral ankle ligaments extending to the base the fifth metatarsal.  Otherwise nontender.  Skin:    General: Skin is warm and dry.     Capillary Refill: Capillary refill takes less than 2 seconds.  Neurological:     Mental Status: She is alert.     Comments: Alert. Clear speech. Sensation grossly intact to bilateral lower extremities. 5/5 strength with plantar/dorsiflexion bilaterally. Patient ambulatory with antalgic gait, no foot drop noted.   Psychiatric:        Mood and Affect: Mood normal.        Behavior: Behavior normal.    ED Treatments / Results  Labs (all labs ordered are listed, but only abnormal results are displayed) Labs Reviewed - No data to display   EKG None  Radiology Dg Foot Complete Right  Result Date: 10/23/2018 CLINICAL DATA:  Acute right foot pain and swelling without known injury. EXAM: RIGHT FOOT COMPLETE - 3+ VIEW COMPARISON:  None. FINDINGS: There is no evidence of fracture or dislocation. There is no evidence of arthropathy or other focal bone abnormality. Soft tissues are unremarkable. IMPRESSION: Negative. Electronically Signed   By: Marijo Conception M.D.   On: 10/23/2018 13:07    Procedures Procedures (including critical care time)  SPLINT APPLICATION Date/Time: 5:02 PM Authorized by: Kennith Maes Consent: Verbal consent obtained. Risks and benefits: risks, benefits and alternatives were discussed Consent given by: patient Splint applied by: ED technician Location details: RLE Splint type: ASO Supplies used: ASO Post-procedure: The splinted body part was neurovascularly unchanged following the procedure. Patient tolerance: Patient tolerated the procedure well with no immediate complications.   Medications Ordered in ED Medications - No data to display   Initial Impression / Assessment and Plan / ED Course  I have reviewed the triage vital signs and the nursing notes.  Pertinent labs & imaging results that were available during my care of the patient were reviewed by me and considered in my medical decision making (see chart for details).    Patient presents to the ED with complaints of pain to the  R foot pain, no acute injury but has new job where she is on her feet all day which she thinks aggravated an injury from a few years ago. Exam without obvious deformity or open wounds. No erythema/warmth/fever to indicate infectious process. ROM intact. Tender to palpation to lateral ankle ligaments to base of 5th metatarsal. NVI distally. Xray negative for fracture/dislocation. Therapeutic splint provided. PRICE recommended. Prescription for meloxicam. I discussed results, treatment plan, need for follow-up w/  sports medicine, and return precautions with the patient. Provided opportunity for questions, patient confirmed understanding and are in agreement with plan.    Final Clinical Impressions(s) / ED Diagnoses   Final diagnoses:  Foot pain, right    ED Discharge Orders         Ordered    meloxicam (MOBIC) 7.5 MG tablet  Daily PRN     10/23/18 1402           Amaryllis Dyke, PA-C 10/23/18 1405    Tegeler, Gwenyth Allegra, MD 10/23/18 1601

## 2018-10-23 NOTE — ED Triage Notes (Addendum)
Pt c/o pain/swelling to right foot after starting a new job 2 days ago-denies injury-NAD-steady gait

## 2018-10-23 NOTE — Discharge Instructions (Signed)
Please read and follow all provided instructions.  You have been seen today for right foot pain.   Tests performed today include: An x-ray of the affected area - does NOT show any broken bones or dislocations.  Vital signs. See below for your results today.   Home care instructions: -- *PRICE in the first 24-48 hours Protect (with brace, splint, sling), if given by your provider Rest Ice- Do not apply ice pack directly to your skin, place towel or similar between your skin and ice/ice pack. Apply ice for 20 min, then remove for 40 min while awake Compression- Wear brace, elastic bandage, splint as directed by your provider Elevate affected extremity above the level of your heart when not walking around for the first 24-48 hours   Medications:  Meloxicam: is a nonsteroidal anti-inflammatory medication that will help with pain and swelling. Be sure to take this medication as prescribed with food, 1-2 pills every 24 hours,  It should be taken with food, as it can cause stomach upset, and more seriously, stomach bleeding. Do not take other nonsteroidal anti-inflammatory medications with this such as Advil, Motrin, Aleve, naproxen, Goodie Powder, or Motrin.    You make take Tylenol per over the counter dosing with these medications.   We have prescribed you new medication(s) today. Discuss the medications prescribed today with your pharmacist as they can have adverse effects and interactions with your other medicines including over the counter and prescribed medications. Seek medical evaluation if you start to experience new or abnormal symptoms after taking one of these medicines, seek care immediately if you start to experience difficulty breathing, feeling of your throat closing, facial swelling, or rash as these could be indications of a more serious allergic reaction   Follow-up instructions: Please follow-up with your primary care provider or the provided orthopedic physician (bone  specialist) if you continue to have significant pain in 1 week. In this case you may have a more severe injury that requires further care.   Return instructions:  Please return if your digits or extremity are numb or tingling, appear gray or blue, or you have severe pain (also elevate the extremity and loosen splint or wrap if you were given one) Please return if you have redness or fevers.  Please return to the Emergency Department if you experience worsening symptoms.  Please return if you have any other emergent concerns. Additional Information:  Your vital signs today were: BP (!) 148/94 (BP Location: Left Arm)    Pulse 76    Temp 98 F (36.7 C) (Oral)    Resp 18    Ht 5\' 3"  (1.6 m)    Wt 115.7 kg    LMP 10/13/2018    SpO2 97%    BMI 45.17 kg/m  If your blood pressure (BP) was elevated above 135/85 this visit, please have this repeated by your doctor within one month. ---------------

## 2018-11-27 ENCOUNTER — Emergency Department
Admission: EM | Admit: 2018-11-27 | Discharge: 2018-11-27 | Disposition: A | Discharge status: Home / Self Care | Payer: Self-pay | Source: Home / Self Care

## 2018-11-27 ENCOUNTER — Other Ambulatory Visit: Payer: Self-pay

## 2018-11-27 DIAGNOSIS — K625 Hemorrhage of anus and rectum: Secondary | ICD-10-CM

## 2018-11-27 DIAGNOSIS — K640 First degree hemorrhoids: Secondary | ICD-10-CM

## 2018-11-27 LAB — POC HEMOCCULT BLD/STL (OFFICE/1-CARD/DIAGNOSTIC): Fecal Occult Blood, POC: POSITIVE — AB

## 2018-11-27 MED ORDER — ZINC OXIDE 40 % EX OINT: 1.0000 "application " | TOPICAL_OINTMENT | CUTANEOUS | 0 refills | Status: AC | PRN

## 2018-11-27 NOTE — Discharge Instructions (Signed)
°  Use caution when taking over the counter NSAIDs such as Advil (ibuprofen), Aleve (Naproxen), or aspirin as taking these medications, especially regularly can increase your risk of stomach and intestinal bleeding.   You may try topical muscle creams or patches to help with sore muscles and alternate with acetaminophen (Tylenol) for body aches.  Please establish care with a primary care provider for ongoing healthcare needs including recheck of symptoms next week if not improving or follow up with a gastroenterologist (GI) stomach specialist.  Call 911 or go to the hospital if you have significant bleeding, pain, develop a fever, dizziness/lightheadedness, or any other new concerning symptoms develop.

## 2018-11-27 NOTE — ED Triage Notes (Signed)
Rectal bleeding x 1 mos. Does not see any streaking in stool but sometimes when she wipes. Did notice change in stool about a month ago which is softer which is when the rectal bleeding started. Pt became concerned when her underwear stained yesterday.

## 2018-11-27 NOTE — ED Provider Notes (Signed)
[facility]    CSN: [id number] Arrival date & time: 11/27/18  1817      History   Chief Complaint Chief Complaint  Patient presents with  . Rectal Bleeding    HPI Desiree Sweeney is a 38 y.o. female.   HPI Desiree Sweeney is a 38 y.o. female presenting to UC with c/o mild intermittent rectal bleeding for about 1 month.  Pt states occasional streaks of red blood on stool or tissue when wiping after a BM.  She has noticed softer stool over the last 3-4 weeks.  Yesterday, she had bleeding that stained her underwear, which concerned the pt. She denies rectal pain or itching. Denies abdominal pain, n/v/d. She has lost some weight over the last 1 month but reports starting a new job 1 month ago, where she does a lot of walking and is on her feet for long hours at a time.  She does take 800mg  ibuprofen daily due to body aches from her new job.  Denies hx of stomach bleeds or hemorrhoids in the past. No known family hx of colon cancer. She does not have a PCP or GYN.  She has not tried anything for the bleeding as she is not sure what is causing the bleeding.   Past Medical History:  Diagnosis Date  . Hypertension     Patient Active Problem List   Diagnosis Date Noted  . Left ovarian cyst 03/06/2013  . Trichimoniasis 03/06/2013  . UTI (lower urinary tract infection) 03/06/2013    Past Surgical History:  Procedure Laterality Date  . TUBAL LIGATION      OB History   No obstetric history on file.      Home Medications    Prior to Admission medications   Medication Sig Start Date End Date Taking? Authorizing Provider  aspirin-sod bicarb-citric acid (ALKA-SELTZER) 325 MG TBEF tablet Take 325 mg by mouth every 6 (six) hours as needed.    [provider]  fluticasone (FLONASE) 50 MCG/ACT nasal spray Place 2 sprays into both nostrils daily. 04/15/18   Couture, Cortni S, PA-C  liver oil-zinc oxide (DESITIN) 40 % ointment Apply 1 application topically as needed for  irritation. Use after bathing and after wiping/using the bathroom. 11/27/18   Noe Gens, PA-C  meloxicam (MOBIC) 7.5 MG tablet Take 1-2 tablets (7.5-15 mg total) by mouth daily as needed for pain. 10/23/18   Petrucelli, Glynda Jaeger, PA-C    Family History History reviewed. No pertinent family history.  Social History Social History   Tobacco Use  . Smoking status: Current Every Day Smoker    Packs/day: 0.50    Types: Cigarettes  . Smokeless tobacco: Never Used  Substance Use Topics  . Alcohol use: Yes    Comment: occ  . Drug use: No     Allergies   Patient has no known allergies.   Review of Systems Review of Systems  Constitutional: Negative for chills and fever.  Gastrointestinal: Positive for anal bleeding and blood in stool. Negative for constipation, diarrhea, nausea, rectal pain and vomiting.  Genitourinary: Negative for dysuria, hematuria, vaginal bleeding, vaginal discharge and vaginal pain.     Physical Exam Triage Vital Signs ED Triage Vitals [11/27/18 1831]  Enc Vitals Group     BP (!) 144/88     Pulse Rate 80     Resp 18     Temp 98.4 F (36.9 C)     Temp Source Oral     SpO2 99 %  Weight 255 lb (115.7 kg)     Height 5\' 3"  (1.6 m)     Head Circumference      Peak Flow      Pain Score 0     Pain Loc      Pain Edu?      Excl. in GC?    No data found.  Updated Vital Signs BP (!) 144/88 (BP Location: Right Arm)   Pulse 80   Temp 98.4 F (36.9 C) (Oral)   Resp 18   Ht 5\' 3"  (1.6 m)   Wt 255 lb (115.7 kg)   LMP 11/11/2018 (Approximate)   SpO2 99%   BMI 45.17 kg/m   Visual Acuity Right Eye Distance:   Left Eye Distance:   Bilateral Distance:    Right Eye Near:   Left Eye Near:    Bilateral Near:     Physical Exam Vitals signs and nursing note reviewed. Exam conducted with a chaperone present.  Constitutional:      Appearance: Normal appearance. She is well-developed.  HENT:     Head: Normocephalic and atraumatic.  Neck:      Musculoskeletal: Normal range of motion.  Cardiovascular:     Rate and Rhythm: Normal rate and regular rhythm.  Pulmonary:     Effort: Pulmonary effort is normal. No respiratory distress.  Genitourinary:    Rectum: Guaiac result positive. External hemorrhoid (small- 2mm or smaller) present. No tenderness.  Musculoskeletal: Normal range of motion.  Skin:    General: Skin is warm and dry.  Neurological:     Mental Status: She is alert and oriented to person, place, and time.  Psychiatric:        Behavior: Behavior normal.      UC Treatments / Results  Labs (all labs ordered are listed, but only abnormal results are displayed) Labs Reviewed  POC HEMOCCULT BLD/STL (OFFICE/1-CARD/DIAGNOSTIC) - Abnormal; Notable for the following components:      Result Value   Fecal Occult Blood, POC Positive (*)    All other components within normal limits    EKG   Radiology No results found.  Procedures Procedures (including critical care time)  Medications Ordered in UC Medications - No data to display  Initial Impression / Assessment and Plan / UC Course  I have reviewed the triage vital signs and the nursing notes.  Pertinent labs & imaging results that were available during my care of the patient were reviewed by me and considered in my medical decision making (see chart for details).     Bleeding likely from small external hemorrhoid Will have pt try Desitin cream as barrier as skin heals.  Encouraged f/u with GI and PCP AVS provided  Final Clinical Impressions(s) / UC Diagnoses   Final diagnoses:  Rectal bleeding  Grade I hemorrhoids     Discharge Instructions      Use caution when taking over the counter NSAIDs such as Advil (ibuprofen), Aleve (Naproxen), or aspirin as taking these medications, especially regularly can increase your risk of stomach and intestinal bleeding.   You may try topical muscle creams or patches to help with sore muscles and alternate with  acetaminophen (Tylenol) for body aches.  Please establish care with a primary care provider for ongoing healthcare needs including recheck of symptoms next week if not improving or follow up with a gastroenterologist (GI) stomach specialist.  Call 911 or go to the hospital if you have significant bleeding, pain, develop a fever, dizziness/lightheadedness, or any  other new concerning symptoms develop.     ED Prescriptions    Medication Sig Dispense Auth. Provider   liver oil-zinc oxide (DESITIN) 40 % ointment Apply 1 application topically as needed for irritation. Use after bathing and after wiping/using the bathroom. 56.7 g Lurene Shadow, [place]     PDMP not reviewed this encounter.   Lurene Shadow, [place] 11/28/18 1047

## 2020-11-30 IMAGING — CR DG FOOT COMPLETE 3+V*R*
3 series · 3 of 3 positions shown · non-contrast
Comparison: None.

CLINICAL DATA: Acute right foot pain and swelling without known
injury.

EXAM:
RIGHT FOOT COMPLETE - 3+ VIEW

[t foot ap right]
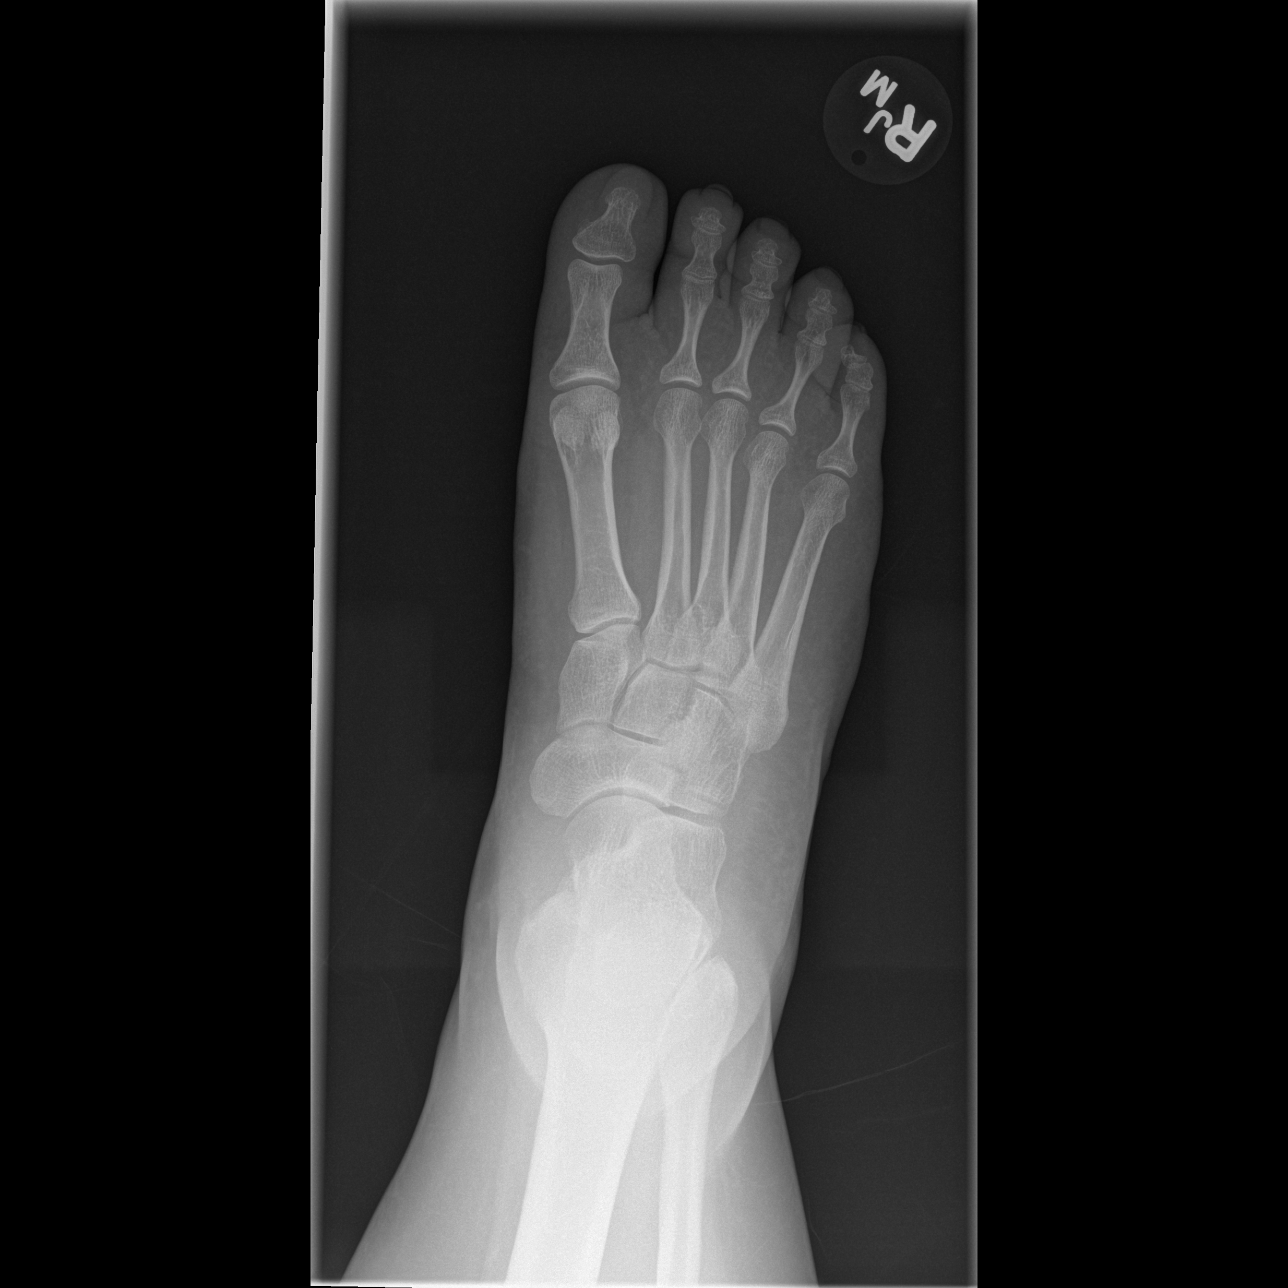

[t foot oblique right]
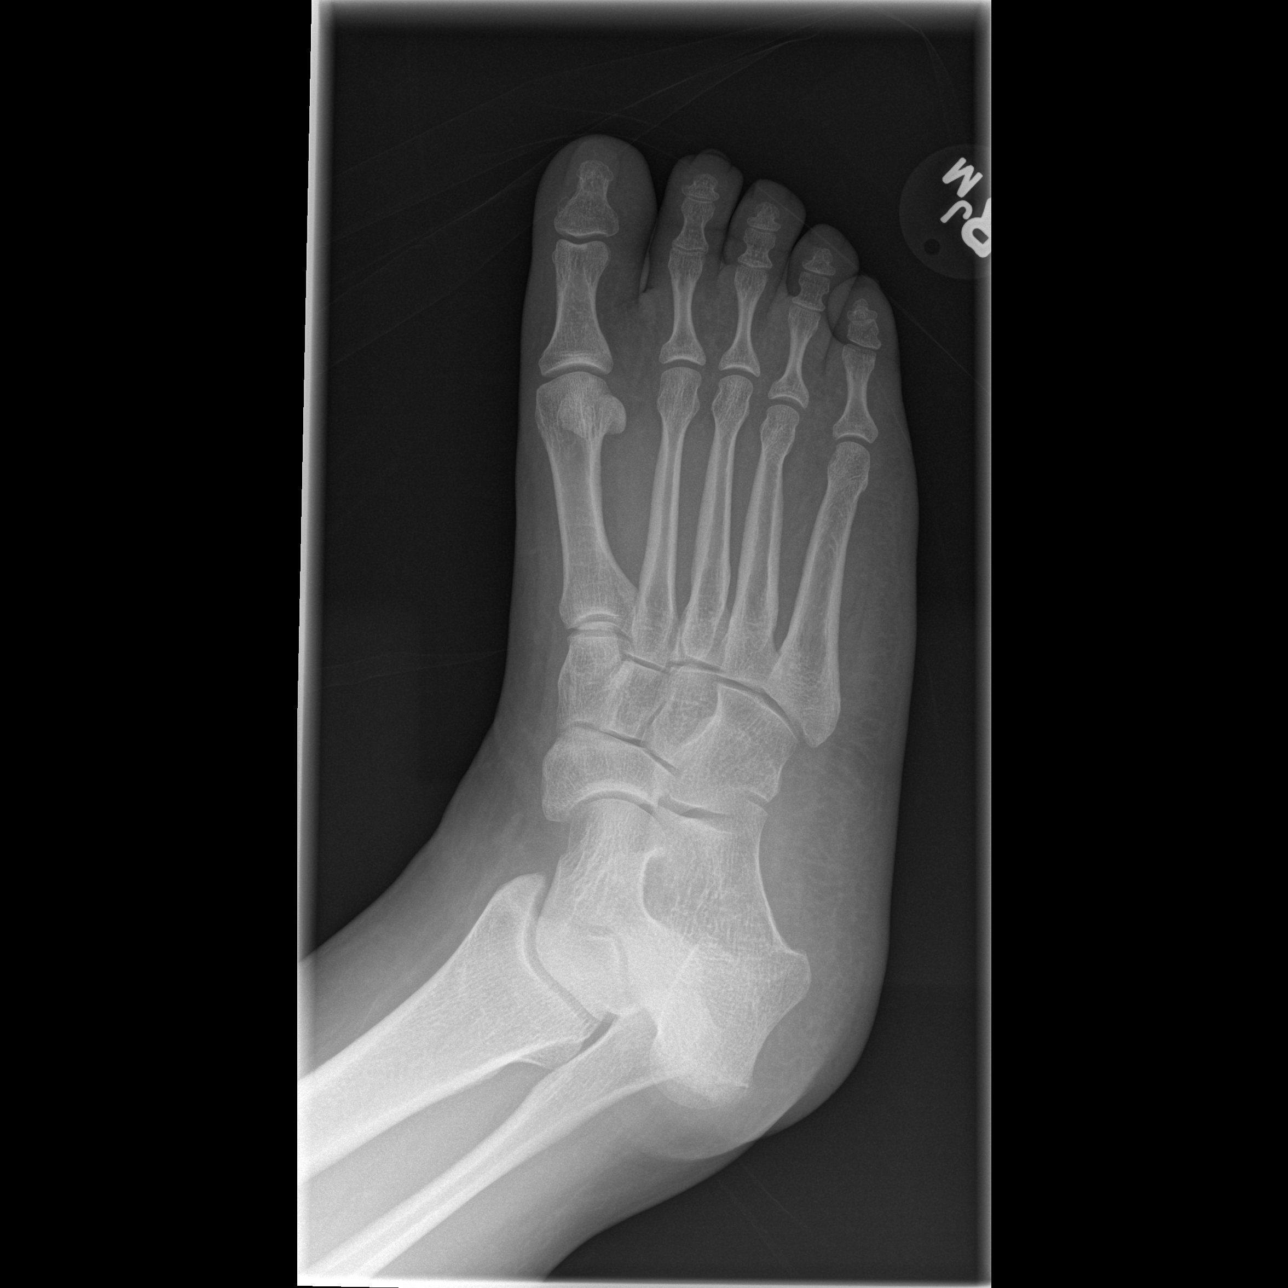

[t foot lat right]
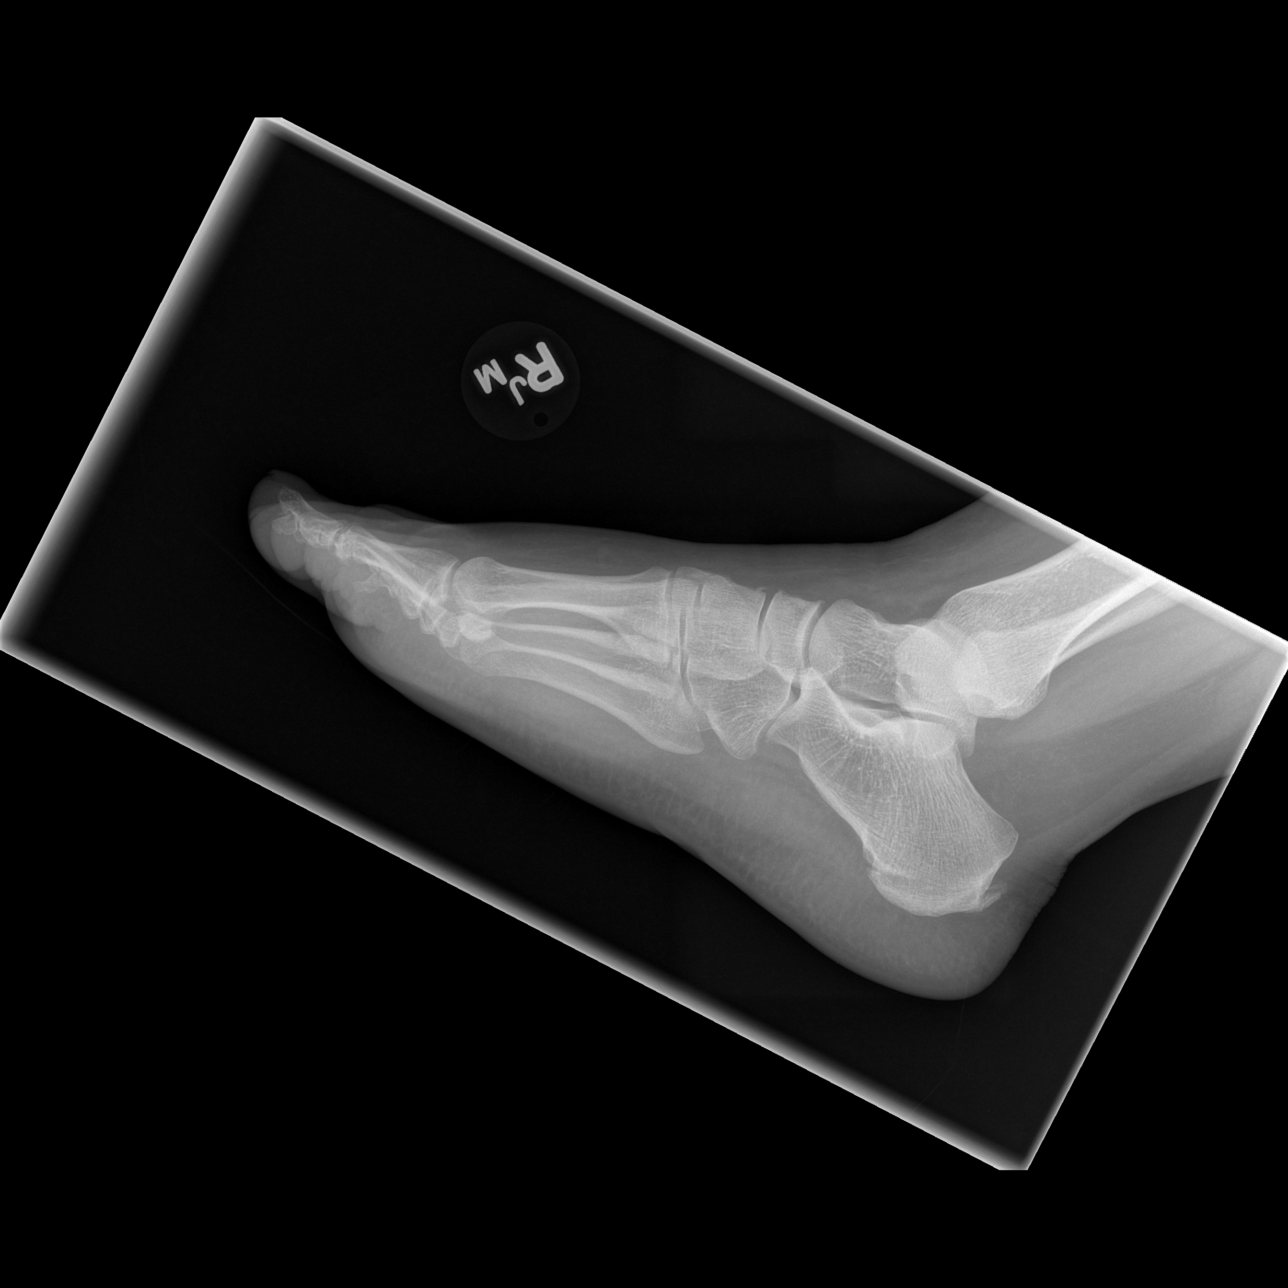

[3 of 3 positions shown; findings below may reference images not displayed]

FINDINGS: There is no evidence of fracture or dislocation. There is no
evidence of arthropathy or other focal bone abnormality. Soft
tissues are unremarkable.
IMPRESSION: Negative.

## 2021-05-30 ENCOUNTER — Other Ambulatory Visit: Payer: Self-pay

## 2021-05-30 ENCOUNTER — Encounter (HOSPITAL_BASED_OUTPATIENT_CLINIC_OR_DEPARTMENT_OTHER): Payer: Self-pay | Admitting: Emergency Medicine

## 2021-05-30 ENCOUNTER — Emergency Department (HOSPITAL_BASED_OUTPATIENT_CLINIC_OR_DEPARTMENT_OTHER)
Admission: EM | Admit: 2021-05-30 | Discharge: 2021-05-30 | Disposition: A | Discharge status: Home / Self Care | Payer: Self-pay | Attending: Emergency Medicine | Admitting: Emergency Medicine

## 2021-05-30 ENCOUNTER — Other Ambulatory Visit (HOSPITAL_BASED_OUTPATIENT_CLINIC_OR_DEPARTMENT_OTHER): Payer: Self-pay

## 2021-05-30 DIAGNOSIS — R051 Acute cough: Secondary | ICD-10-CM

## 2021-05-30 DIAGNOSIS — J069 Acute upper respiratory infection, unspecified: Secondary | ICD-10-CM | POA: Insufficient documentation

## 2021-05-30 DIAGNOSIS — Z20822 Contact with and (suspected) exposure to covid-19: Secondary | ICD-10-CM | POA: Insufficient documentation

## 2021-05-30 LAB — RESP PANEL BY RT-PCR (FLU A&B, COVID) ARPGX2
Influenza A by PCR: NEGATIVE
Influenza B by PCR: NEGATIVE
SARS Coronavirus 2 by RT PCR: NEGATIVE

## 2021-05-30 LAB — GROUP A STREP BY PCR: Group A Strep by PCR: NOT DETECTED

## 2021-05-30 MED ORDER — BENZONATATE 100 MG PO CAPS
100.0000 mg | ORAL_CAPSULE | Freq: Three times a day (TID) | ORAL | 0 refills | Status: AC
  Filled 2021-05-30: qty 15, 5d supply, fill #0

## 2021-05-30 MED ORDER — DEXAMETHASONE SODIUM PHOSPHATE 10 MG/ML IJ SOLN
10.0000 mg | Freq: Once | INTRAMUSCULAR | Status: AC
  Administered 2021-05-30: 10 mg via INTRAMUSCULAR
  Filled 2021-05-30: qty 1

## 2021-05-30 NOTE — ED Notes (Signed)
ED Provider at bedside. 

## 2021-05-30 NOTE — ED Provider Notes (Signed)
?[facility] EMERGENCY DEPARTMENT ?Provider Note ? ? ?CSN: [id number] ?Arrival date & time: 05/30/21  0954 ? ?  ? ?History ? ?Chief Complaint  ?Patient presents with  ? Cough  ? Sore Throat  ? ? ?Desiree Sweeney is a 41 y.o. female. ? ?Patient with no significant past medical history presents to the emergency department today for evaluation of URI symptoms.  Symptoms started 3 days ago.  It began as a sore throat with nasal congestion.  Patient had increasing malaise and fatigue as well as sore throat and development of cough over the past 2 days.  She denies fevers or shaking chills.  Her chest hurts when she coughs.  She has had nausea and an episode of vomiting after taking NyQuil.  No abdominal pain.  No urinary symptoms.  Patient works at a school.  She has also tried Mucinex without much improvement.  No history of lung problems.  Patient does use tobacco. ? ? ?  ? ?Home Medications ?Prior to Admission medications   ?Medication Sig Start Date End Date Taking? Authorizing Provider  ?aspirin-sod bicarb-citric acid (ALKA-SELTZER) 325 MG TBEF tablet Take 325 mg by mouth every 6 (six) hours as needed.    [provider]  ?fluticasone (FLONASE) 50 MCG/ACT nasal spray Place 2 sprays into both nostrils daily. 04/15/18   Couture, Cortni S, PA-C  ?liver oil-zinc oxide (DESITIN) 40 % ointment Apply 1 application topically as needed for irritation. Use after bathing and after wiping/using the bathroom. 11/27/18   Lurene ShadowPhelps, Erin O, PA-C  ?meloxicam (MOBIC) 7.5 MG tablet Take 1-2 tablets (7.5-15 mg total) by mouth daily as needed for pain. 10/23/18   Petrucelli, Pleas KochSamantha R, PA-C  ?   ? ?Allergies    ?Patient has no known allergies.   ? ?Review of Systems   ?Review of Systems ? ?Physical Exam ?Updated Vital Signs ?BP (!) 158/110 (BP Location: Left Arm)   Pulse 92   Temp 98.4 ?F (36.9 ?C) (Oral)   Resp 18   Ht 5\' 3"  (1.6 m)   Wt 115 kg   SpO2 97%   BMI 44.91 kg/m?  ?Physical Exam ?Vitals and nursing note  reviewed.  ?Constitutional:   ?   Appearance: She is well-developed.  ?HENT:  ?   Head: Normocephalic and atraumatic.  ?   Jaw: No trismus.  ?   Right Ear: Tympanic membrane, ear canal and external ear normal.  ?   Left Ear: Tympanic membrane, ear canal and external ear normal.  ?   Nose: Congestion present. No mucosal edema or rhinorrhea.  ?   Mouth/Throat:  ?   Mouth: Mucous membranes are moist. Mucous membranes are not dry. No oral lesions.  ?   Pharynx: Uvula midline. Posterior oropharyngeal erythema present. No oropharyngeal exudate or uvula swelling.  ?   Tonsils: No tonsillar abscesses.  ?Eyes:  ?   General:     ?   Right eye: No discharge.     ?   Left eye: No discharge.  ?   Conjunctiva/sclera: Conjunctivae normal.  ?Cardiovascular:  ?   Rate and Rhythm: Normal rate and regular rhythm.  ?   Heart sounds: Normal heart sounds.  ?Pulmonary:  ?   Effort: Pulmonary effort is normal. No respiratory distress.  ?   Breath sounds: Normal breath sounds. No wheezing or rales.  ?   Comments: Lungs are clear to auscultation bilaterally but patient does have frequent coughing during exam. ?Abdominal:  ?   Palpations:  Abdomen is soft.  ?   Tenderness: There is no abdominal tenderness.  ?Musculoskeletal:  ?   Cervical back: Normal range of motion and neck supple.  ?Lymphadenopathy:  ?   Cervical: No cervical adenopathy.  ?Skin: ?   General: Skin is warm and dry.  ?Neurological:  ?   Mental Status: She is alert.  ?Psychiatric:     ?   Mood and Affect: Mood normal.  ? ? ?ED Results / Procedures / Treatments   ?Labs ?(all labs ordered are listed, but only abnormal results are displayed) ?Labs Reviewed  ?RESP PANEL BY RT-PCR (FLU A&B, COVID) ARPGX2  ?GROUP A STREP BY PCR  ? ? ?EKG ?None ? ?Radiology ?No results found. ? ?Procedures ?Procedures  ? ? ?Medications Ordered in ED ?Medications - No data to display ? ?ED Course/ Medical Decision Making/ A&P ?  ? ?Patient seen and examined. History obtained directly from patient.   ? ?Labs/EKG: Ordered strep, COVID/flu testing. ? ?Imaging: Considered chest x-ray however patient with other URI symptoms no fever, lungs are clear to auscultation bilaterally, low concern for pneumonia. ? ?Medications/Fluids: None ordered. ? ?Most recent vital signs reviewed and are as follows: ?BP (!) 158/110 (BP Location: Left Arm)   Pulse 92   Temp 98.4 ?F (36.9 ?C) (Oral)   Resp 18   Ht 5\' 3"  (1.6 m)   Wt 115 kg   SpO2 97%   BMI 44.91 kg/m?  ? ?Initial impression: Upper respiratory infection with sore throat and cough ? ?11:36 AM Reassessment performed. Patient appears comfortable, continues coughing.  Discussed symptom control.  Offered IM Decadron for her ongoing sore throat and she accepts. ? ?Labs personally reviewed and interpreted including: Negative COVID, strep, flu. ? ?Reviewed pertinent lab work and imaging with patient at bedside. Questions answered.  ? ?Most current vital signs reviewed and are as follows: ?BP (!) 148/89   Pulse 83   Temp 98.4 ?F (36.9 ?C) (Oral)   Resp 18   Ht 5\' 3"  (1.6 m)   Wt 115 kg   SpO2 97%   BMI 44.91 kg/m?  ? ?Plan: Discharge to home.  ? ?Prescriptions written for: Tessalon ? ?Other home care instructions discussed: OTC meds, NSAIDs, Tylenol, rest, hydration ? ?ED return instructions discussed: Shortness of breath, chest pain, persistent vomiting ? ?Follow-up instructions discussed: Patient encouraged to follow-up with their PCP in 3-5 days, if not improving.  ? ?                        ?Medical Decision Making ?Risk ?Prescription drug management. ? ? ?Patient with symptoms consistent with a viral syndrome.  Negative strep, flu and COVID testing.  Vitals are stable, no fever. No signs of dehydration. Lung exam normal, low concern for pneumonia.  No persistent chest pain to suggest ACS, PE, pneumothorax.  Supportive therapy indicated with return if symptoms worsen.   ? ?In regards to the patient's sore throat today, the following dangerous and potentially life  threatening etiologies were considered on the differential diagnosis: Lugwig's angina, uvulitis, epiglottis, peritonsillar abscess, retropharyngeal abscess, Lemierre's syndrome. Also considered were more common causes such as: streptococcal pharyngitis, gonococcal pharyngitis, non-bacterial pharyngitis (cold viruses, HSV/coxsackievirus, influenza, COVID-19, infectious mononucleosis, oropharyngeal candidiasis), and other non-infectious causes including seasonal allergies/post-nasal drip, GERD/esophagitis, trauma.  ? ?The patient's vital signs, pertinent lab work and imaging were reviewed and interpreted as discussed in the ED course. Hospitalization was considered for further testing, treatments, or serial exams/observation.  However as patient is well-appearing, has a stable exam, and reassuring studies today, I do not feel that they warrant admission at this time. This plan was discussed with the patient who verbalizes agreement and comfort with this plan and seems reliable and able to return to the Emergency Department with worsening or changing symptoms.  ? ? ? ? ? ? ? ? ? ?Final Clinical Impression(s) / ED Diagnoses ?Final diagnoses:  ?Viral upper respiratory tract infection  ?Acute cough  ? ? ?Rx / DC Orders ?ED Discharge Orders   ? ?      Ordered  ?  benzonatate (TESSALON) 100 MG capsule  Every 8 hours       ? 05/30/21 1134  ? ?  ?  ? ?  ? ? ?  ?Renne Crigler, PA-C ?05/30/21 1138 ? ?  ?Vanetta Mulders, MD ?06/04/21 1516 ? ?

## 2021-05-30 NOTE — ED Triage Notes (Signed)
Cough and sore throat x 3 days hurts to cough ?

## 2021-05-30 NOTE — Discharge Instructions (Signed)
Please read and follow all provided instructions. ? ?Your diagnoses today include:  ?1. Viral upper respiratory tract infection   ?2. Acute cough   ? ? ?You appear to have an upper respiratory infection (URI). An upper respiratory tract infection, or cold, is a viral infection of the air passages leading to the lungs. It should improve gradually after 5-7 days. You may have a lingering cough that lasts for 2- 4 weeks after the infection. ? ?Tests performed today include: ?Vital signs. See below for your results today.  ?COVID and flu, strep test: All negative ? ?Medications prescribed:  ?Tessalon Perles - cough suppressant medication ? ?Take any prescribed medications only as directed. Treatment for your infection is aimed at treating the symptoms. There are no medications, such as antibiotics, that will cure your infection.  ? ?Home care instructions:  ?You can take Tylenol and/or Ibuprofen as directed on the packaging for fever reduction and pain relief.  ?  ?For cough: honey 1/2 to 1 teaspoon (you can dilute the honey in water or another fluid).  You can also use guaifenesin and dextromethorphan for cough. You can use a humidifier for chest congestion and cough.  If you don't have a humidifier, you can sit in the bathroom with the hot shower running.    ?  ?For sore throat: try warm salt water gargles, cepacol lozenges, throat spray, warm tea or water with lemon/honey, popsicles or ice, or OTC cold relief medicine for throat discomfort.  ?  ?For congestion: take a daily anti-histamine like Zyrtec, Claritin, and a oral decongestant, such as pseudoephedrine.  You can also use Flonase 1-2 sprays in each nostril daily.  ?  ?It is important to stay hydrated: drink plenty of fluids (water, gatorade/powerade/pedialyte, juices, or teas) to keep your throat moisturized and help further relieve irritation/discomfort.  ? ?Your illness is contagious and can be spread to others, especially during the first 3 or 4 days. It  cannot be cured by antibiotics or other medicines. Take basic precautions such as washing your hands often, covering your mouth when you cough or sneeze, and avoiding public places where you could spread your illness to others.  ? ?Please continue drinking plenty of fluids.  Use over-the-counter medicines as needed as directed on packaging for symptom relief.  You may also use ibuprofen or tylenol as directed on packaging for pain or fever.  Do not take multiple medicines containing Tylenol or acetaminophen to avoid taking too much of this medication. ? ?Follow-up instructions: ?Please follow-up with your primary care provider in the next 3 days for further evaluation of your symptoms if you are not feeling better.  ? ?Return instructions:  ?Please return to the Emergency Department if you experience worsening symptoms.  ?RETURN IMMEDIATELY IF you develop shortness of breath, confusion or altered mental status, a new rash, become dizzy, faint, or poorly responsive, or are unable to be cared for at home. ?Please return if you have persistent vomiting and cannot keep down fluids or develop a fever that is not controlled by tylenol or motrin.   ?Please return if you have any other emergent concerns. ? ?Additional Information: ? ?Your vital signs today were: ?BP (!) 148/89   Pulse 83   Temp 98.4 ?F (36.9 ?C) (Oral)   Resp 18   Ht 5\' 3"  (1.6 m)   Wt 115 kg   SpO2 97%   BMI 44.91 kg/m?  ?If your blood pressure (BP) was elevated above 135/85 this visit, please have  this repeated by your doctor within one month. ?-------------- ? ?

## 2021-08-09 ENCOUNTER — Other Ambulatory Visit: Payer: Self-pay

## 2022-01-30 ENCOUNTER — Emergency Department (HOSPITAL_BASED_OUTPATIENT_CLINIC_OR_DEPARTMENT_OTHER)
Admission: EM | Admit: 2022-01-30 | Discharge: 2022-01-30 | Disposition: A | Discharge status: Home / Self Care | Payer: BC Managed Care – PPO | Attending: Emergency Medicine | Admitting: Emergency Medicine

## 2022-01-30 ENCOUNTER — Other Ambulatory Visit: Payer: Self-pay

## 2022-01-30 ENCOUNTER — Encounter (HOSPITAL_BASED_OUTPATIENT_CLINIC_OR_DEPARTMENT_OTHER): Payer: Self-pay | Admitting: Urology

## 2022-01-30 DIAGNOSIS — Z79899 Other long term (current) drug therapy: Secondary | ICD-10-CM | POA: Insufficient documentation

## 2022-01-30 DIAGNOSIS — U071 COVID-19: Secondary | ICD-10-CM

## 2022-01-30 DIAGNOSIS — I10 Essential (primary) hypertension: Secondary | ICD-10-CM | POA: Insufficient documentation

## 2022-01-30 DIAGNOSIS — R519 Headache, unspecified: Secondary | ICD-10-CM | POA: Diagnosis present

## 2022-01-30 DIAGNOSIS — Z7982 Long term (current) use of aspirin: Secondary | ICD-10-CM | POA: Diagnosis not present

## 2022-01-30 LAB — RESP PANEL BY RT-PCR (RSV, FLU A&B, COVID)  RVPGX2
Influenza A by PCR: NEGATIVE
Influenza B by PCR: NEGATIVE
Resp Syncytial Virus by PCR: NEGATIVE
SARS Coronavirus 2 by RT PCR: POSITIVE — AB

## 2022-01-30 NOTE — Discharge Instructions (Addendum)
Motrin and Tylenol as needed as directed. Coricidin HBP as needed as directed for symptom relief. Recheck with your doctor as needed.  Home to quarantine for the first 5 days of illness, may then return to work with a mask for the next 5.

## 2022-01-30 NOTE — ED Provider Notes (Signed)
[facility] EMERGENCY DEPARTMENT Provider Note   CSN: [id number] Arrival date & time: 01/30/22  1739     History  Chief Complaint  Patient presents with   Flu symptoms    Desiree Sweeney is a 42 y.o. female.  42 year old female presents with complaint of headache, sore throat, body aches and cough.  Symptoms started yesterday after exposure to her kids who have tested positive for COVID.  Has medical history of hypertension.  No other complaints or concerns.       Home Medications Prior to Admission medications   Medication Sig Start Date End Date Taking? Authorizing Provider  aspirin-sod bicarb-citric acid (ALKA-SELTZER) 325 MG TBEF tablet Take 325 mg by mouth every 6 (six) hours as needed.    [provider]  benzonatate (TESSALON) 100 MG capsule Take 1 capsule (100 mg total) by mouth every 8 (eight) hours. 05/30/21   Carlisle Cater, PA-C  fluticasone (FLONASE) 50 MCG/ACT nasal spray Place 2 sprays into both nostrils daily. 04/15/18   Couture, Cortni S, PA-C  liver oil-zinc oxide (DESITIN) 40 % ointment Apply 1 application topically as needed for irritation. Use after bathing and after wiping/using the bathroom. 11/27/18   Noe Gens, PA-C  meloxicam (MOBIC) 7.5 MG tablet Take 1-2 tablets (7.5-15 mg total) by mouth daily as needed for pain. 10/23/18   Petrucelli, Glynda Jaeger, PA-C      Allergies    Patient has no known allergies.    Review of Systems   Review of Systems Negative except as per HPI Physical Exam Updated Vital Signs BP (!) 148/102 (BP Location: Right Arm)   Pulse 78   Temp 98.4 F (36.9 C) (Oral)   Resp 18   Ht 5\' 3"  (1.6 m)   Wt 125.2 kg   LMP 01/23/2021 (Approximate)   SpO2 99%   BMI 48.89 kg/m  Physical Exam Vitals and nursing note reviewed.  Constitutional:      General: She is not in acute distress.    Appearance: She is well-developed. She is not diaphoretic.  HENT:     Head: Normocephalic and atraumatic.     Right Ear:  Tympanic membrane and ear canal normal.     Left Ear: Tympanic membrane and ear canal normal.     Nose: Congestion present.     Mouth/Throat:     Mouth: Mucous membranes are moist.  Eyes:     Conjunctiva/sclera: Conjunctivae normal.  Cardiovascular:     Rate and Rhythm: Normal rate and regular rhythm.     Heart sounds: Normal heart sounds.  Pulmonary:     Effort: Pulmonary effort is normal.     Breath sounds: Normal breath sounds.  Musculoskeletal:     Cervical back: Neck supple.  Lymphadenopathy:     Cervical: No cervical adenopathy.  Skin:    General: Skin is warm and dry.     Findings: No erythema or rash.  Neurological:     Mental Status: She is alert and oriented to person, place, and time.  Psychiatric:        Behavior: Behavior normal.     ED Results / Procedures / Treatments   Labs (all labs ordered are listed, but only abnormal results are displayed) Labs Reviewed  RESP PANEL BY RT-PCR (RSV, FLU A&B, COVID)  RVPGX2 - Abnormal; Notable for the following components:      Result Value   SARS Coronavirus 2 by RT PCR POSITIVE (*)    All other components within normal  limits    EKG None  Radiology No results found.  Procedures Procedures    Medications Ordered in ED Medications - No data to display  ED Course/ Medical Decision Making/ A&P                           Medical Decision Making  42 year old female with URI symptoms onset yesterday.  Exposed to her children who have tested positive for COVID.  Patient is positive for COVID.  Her exam is reassuring, she has mild nasal congestion.  Vitals are reviewed and reassuring including O2 sat 96% on room air.  Patient does have a history of hypertension, her blood pressure is elevated.  Recommend Coricidin HBP for symptom relief as well as Motrin Tylenol.  Recheck with PCP as needed.        Final Clinical Impression(s) / ED Diagnoses Final diagnoses:  COVID    Rx / DC Orders ED Discharge Orders      None         Jeannie Fend, PA-C 01/30/22 1945    Glyn Ade, MD 02/03/22 1453

## 2022-01-30 NOTE — ED Triage Notes (Signed)
Pt states congestion, body aches, ha, sore throat, and cough that started Friday  Denies fever  Family + for covid

## 2022-04-06 ENCOUNTER — Encounter (HOSPITAL_BASED_OUTPATIENT_CLINIC_OR_DEPARTMENT_OTHER): Payer: Self-pay | Admitting: Pediatrics

## 2022-04-06 ENCOUNTER — Other Ambulatory Visit: Payer: Self-pay

## 2022-04-06 ENCOUNTER — Emergency Department (HOSPITAL_BASED_OUTPATIENT_CLINIC_OR_DEPARTMENT_OTHER)
Admission: EM | Admit: 2022-04-06 | Discharge: 2022-04-06 | Disposition: A | Discharge status: Home / Self Care | Payer: BC Managed Care – PPO | Attending: Emergency Medicine | Admitting: Emergency Medicine

## 2022-04-06 DIAGNOSIS — R21 Rash and other nonspecific skin eruption: Secondary | ICD-10-CM | POA: Diagnosis present

## 2022-04-06 DIAGNOSIS — W57XXXA Bitten or stung by nonvenomous insect and other nonvenomous arthropods, initial encounter: Secondary | ICD-10-CM | POA: Diagnosis not present

## 2022-04-06 DIAGNOSIS — Z7982 Long term (current) use of aspirin: Secondary | ICD-10-CM | POA: Insufficient documentation

## 2022-04-06 MED ORDER — CEPHALEXIN 500 MG PO CAPS: 500.0000 mg | ORAL_CAPSULE | Freq: Four times a day (QID) | ORAL | 0 refills | Status: AC

## 2022-04-06 NOTE — ED Notes (Signed)
Discharge paperwork reviewed entirely with patient, including Rx's and follow up care. Pain was under control. Pt verbalized understanding as well as all parties involved. No questions or concerns voiced at the time of discharge. No acute distress noted.   Pt ambulated out to PVA without incident or assistance.  

## 2022-04-06 NOTE — Discharge Instructions (Signed)
Do warm compresses over the forehead, you can take Tylenol Motrin for pain.  Take the antibiotic 1000 mg in the morning or in the evening for the next 5 days.  Return to the ED for fevers, new or concerning symptoms.  See your doctor next week to make sure the skin changes fully resolved.

## 2022-04-06 NOTE — ED Triage Notes (Signed)
C/O insect bite on forehead started on Monday; and been cleaning it with hydrogen peroxide as well as applying hydrocortisone and it continues to bother her. C/O bilateral tenderness behind her ear started today.

## 2022-04-06 NOTE — ED Provider Notes (Signed)
[facility] [facility] Provider Note   CSN: [id number] Arrival date & time: 04/06/22  1853     History  Chief Complaint  Patient presents with   Insect Bite    Desiree Sweeney is a 42 y.o. female.  HPI   Patient presents due to insect bite versus skin infection.  Patient states about a week ago she noticed a bump on her forehead, was initially larger and it has been getting somewhat smaller with alcohol, peroxide and hydrocortisone cream.  It is still large, she has a painful bump on her forehead which is mobile.  Coworker was concerned she got bit by bugs came to ED for further evaluation.  No fevers, no chills.  She does have some pain behind her ears and lymphadenopathy which she is concerned about.  Home Medications Prior to Admission medications   Medication Sig Start Date End Date Taking? Authorizing Provider  aspirin-sod bicarb-citric acid (ALKA-SELTZER) 325 MG TBEF tablet Take 325 mg by mouth every 6 (six) hours as needed.    [provider]  benzonatate (TESSALON) 100 MG capsule Take 1 capsule (100 mg total) by mouth every 8 (eight) hours. 05/30/21   Carlisle Cater, PA-C  fluticasone (FLONASE) 50 MCG/ACT nasal spray Place 2 sprays into both nostrils daily. 04/15/18   Couture, Cortni S, PA-C  liver oil-zinc oxide (DESITIN) 40 % ointment Apply 1 application topically as needed for irritation. Use after bathing and after wiping/using the bathroom. 11/27/18   Noe Gens, PA-C  meloxicam (MOBIC) 7.5 MG tablet Take 1-2 tablets (7.5-15 mg total) by mouth daily as needed for pain. 10/23/18   Petrucelli, Glynda Jaeger, PA-C      Allergies    Patient has no known allergies.    Review of Systems   Review of Systems  Physical Exam Updated Vital Signs BP (!) 130/102 (BP Location: Left Arm)   Pulse 91   Temp 98.8 F (37.1 C) (Oral)   Resp 16   Ht '5\' 3"'$  (1.6 m)   Wt 124.9 kg   LMP 03/17/2022   SpO2 98%   BMI 48.78 kg/m  Physical  Exam Vitals and nursing note reviewed. Exam conducted with a chaperone present.  Constitutional:      General: She is not in acute distress.    Appearance: Normal appearance.  HENT:     Head: Normocephalic and atraumatic.     Comments: 2 cm indurated area to forehead without overlying erythema.     Right Ear: Tympanic membrane normal.     Left Ear: Tympanic membrane normal.     Ears:     Comments: No mastoid swelling or signs of mastoiditis, TM is clear bilaterally Eyes:     General: No scleral icterus.    Extraocular Movements: Extraocular movements intact.     Pupils: Pupils are equal, round, and reactive to light.  Neck:     Comments: Preauricular lymphadenopathy Skin:    Coloration: Skin is not jaundiced.  Neurological:     Mental Status: She is alert. Mental status is at baseline.     Coordination: Coordination normal.     ED Results / Procedures / Treatments   Labs (all labs ordered are listed, but only abnormal results are displayed) Labs Reviewed - No data to display  EKG None  Radiology No results found.  Procedures Procedures    Medications Ordered in ED Medications - No data to display  ED Course/ Medical Decision Making/ A&P  Medical Decision Making  Patient presents due to forehead skin changes.  I do not see any indications of a spider bite and she does not remember being in a place where she would have been exposed to insect bite.  Clinically, no SIRS criteria patient is not septic.  I do not appreciate an abscess although there is a slightly indurated area to the forehead.  Ear exam is negative for AOM, otitis externa, necrotizing otitis externa, mastoiditis.  Does have some slight preauricular lymphadenopathy, suspect secondary to inflammation forehead.  Will cover with antibiotic in case of developing abscess and recommended warm compresses and follow-up with PCP.  Stable for outpatient follow-up.        Final  Clinical Impression(s) / ED Diagnoses Final diagnoses:  None    Rx / DC Orders ED Discharge Orders     None         Sherrill Raring, Hershal Coria 04/06/22 2309    Drenda Freeze, MD 04/07/22 [phone]

## 2023-02-19 ENCOUNTER — Emergency Department (HOSPITAL_BASED_OUTPATIENT_CLINIC_OR_DEPARTMENT_OTHER)
Admission: EM | Admit: 2023-02-19 | Discharge: 2023-02-19 | Disposition: A | Discharge status: Home / Self Care | Payer: 59 | Attending: Emergency Medicine | Admitting: Emergency Medicine

## 2023-02-19 ENCOUNTER — Other Ambulatory Visit: Payer: Self-pay

## 2023-02-19 ENCOUNTER — Encounter (HOSPITAL_BASED_OUTPATIENT_CLINIC_OR_DEPARTMENT_OTHER): Payer: Self-pay | Admitting: Emergency Medicine

## 2023-02-19 ENCOUNTER — Emergency Department (HOSPITAL_BASED_OUTPATIENT_CLINIC_OR_DEPARTMENT_OTHER): Payer: Self-pay

## 2023-02-19 ENCOUNTER — Other Ambulatory Visit (HOSPITAL_BASED_OUTPATIENT_CLINIC_OR_DEPARTMENT_OTHER): Payer: Self-pay

## 2023-02-19 DIAGNOSIS — Z20822 Contact with and (suspected) exposure to covid-19: Secondary | ICD-10-CM | POA: Insufficient documentation

## 2023-02-19 DIAGNOSIS — J111 Influenza due to unidentified influenza virus with other respiratory manifestations: Secondary | ICD-10-CM

## 2023-02-19 DIAGNOSIS — J09X2 Influenza due to identified novel influenza A virus with other respiratory manifestations: Secondary | ICD-10-CM | POA: Insufficient documentation

## 2023-02-19 DIAGNOSIS — R0602 Shortness of breath: Secondary | ICD-10-CM | POA: Diagnosis present

## 2023-02-19 LAB — RESP PANEL BY RT-PCR (RSV, FLU A&B, COVID)  RVPGX2
Influenza A by PCR: POSITIVE — AB
Influenza B by PCR: NEGATIVE
Resp Syncytial Virus by PCR: NEGATIVE
SARS Coronavirus 2 by RT PCR: NEGATIVE

## 2023-02-19 MED ORDER — OSELTAMIVIR PHOSPHATE 75 MG PO CAPS
75.0000 mg | ORAL_CAPSULE | Freq: Two times a day (BID) | ORAL | 0 refills | Status: AC
  Filled 2023-02-19: qty 10, 5d supply, fill #0

## 2023-02-19 MED ORDER — ALBUTEROL SULFATE HFA 108 (90 BASE) MCG/ACT IN AERS: 2.0000 | INHALATION_SPRAY | RESPIRATORY_TRACT | Status: DC | PRN

## 2023-02-19 MED ORDER — ALBUTEROL SULFATE HFA 108 (90 BASE) MCG/ACT IN AERS
2.0000 | INHALATION_SPRAY | Freq: Once | RESPIRATORY_TRACT | Status: AC
  Administered 2023-02-19: 2 via RESPIRATORY_TRACT
  Filled 2023-02-19: qty 6.7

## 2023-02-19 MED ORDER — OXYMETAZOLINE HCL 0.05 % NA SOLN
2.0000 | Freq: Once | NASAL | Status: AC
  Administered 2023-02-19: 2 via NASAL
  Filled 2023-02-19: qty 30

## 2023-02-19 MED ORDER — IBUPROFEN 800 MG PO TABS
800.0000 mg | ORAL_TABLET | Freq: Once | ORAL | Status: AC
  Administered 2023-02-19: 800 mg via ORAL
  Filled 2023-02-19: qty 1

## 2023-02-19 MED ORDER — ONDANSETRON 4 MG PO TBDP
4.0000 mg | ORAL_TABLET | Freq: Once | ORAL | Status: AC
  Administered 2023-02-19: 4 mg via ORAL
  Filled 2023-02-19: qty 1

## 2023-02-19 MED ORDER — ACETAMINOPHEN 325 MG PO TABS
650.0000 mg | ORAL_TABLET | Freq: Once | ORAL | Status: AC
  Administered 2023-02-19: 650 mg via ORAL
  Filled 2023-02-19: qty 2

## 2023-02-19 NOTE — ED Provider Notes (Signed)
[facility] Provider Note   CSN: [id number] Arrival date & time: 02/19/23  1410     History  Chief Complaint  Patient presents with   Shortness of Breath    Desiree Sweeney is a 43 y.o. female.  Patient is a 43 year old female presenting for nasal congestion, sore throat, shortness of breath, coughing, body aches, chills, and nausea.  Symptoms started approximately 24 hours ago.  Patient denies tobacco use but admits to vaping.  Denies vomiting, abdominal pain, or diarrhea.  The history is provided by the patient. No language interpreter was used.  Shortness of Breath Associated symptoms: cough and vomiting   Associated symptoms: no abdominal pain, no chest pain, no ear pain, no fever, no rash and no sore throat        Home Medications Prior to Admission medications   Medication Sig Start Date End Date Taking? Authorizing Provider  oseltamivir (TAMIFLU) 75 MG capsule Take 1 capsule (75 mg total) by mouth every 12 (twelve) hours. 02/19/23  Yes Edwin Dada P, DO  aspirin-sod bicarb-citric acid (ALKA-SELTZER) 325 MG TBEF tablet Take 325 mg by mouth every 6 (six) hours as needed.    [provider]  benzonatate (TESSALON) 100 MG capsule Take 1 capsule (100 mg total) by mouth every 8 (eight) hours. 05/30/21   Renne Crigler, PA-C  cephALEXin (KEFLEX) 500 MG capsule Take 1 capsule (500 mg total) by mouth 4 (four) times daily. 04/06/22   Theron Arista, PA-C  fluticasone (FLONASE) 50 MCG/ACT nasal spray Place 2 sprays into both nostrils daily. 04/15/18   Couture, Cortni S, PA-C  liver oil-zinc oxide (DESITIN) 40 % ointment Apply 1 application topically as needed for irritation. Use after bathing and after wiping/using the bathroom. 11/27/18   Lurene Shadow, PA-C  meloxicam (MOBIC) 7.5 MG tablet Take 1-2 tablets (7.5-15 mg total) by mouth daily as needed for pain. 10/23/18   Petrucelli, Pleas Koch, PA-C      Allergies    Patient has no  known allergies.    Review of Systems   Review of Systems  Constitutional:  Positive for chills. Negative for fever.  HENT:  Positive for congestion. Negative for ear pain and sore throat.   Eyes:  Negative for pain and visual disturbance.  Respiratory:  Positive for cough and shortness of breath.   Cardiovascular:  Negative for chest pain and palpitations.  Gastrointestinal:  Positive for vomiting. Negative for abdominal pain.  Genitourinary:  Negative for dysuria and hematuria.  Musculoskeletal:  Positive for myalgias. Negative for arthralgias and back pain.  Skin:  Negative for color change and rash.  Neurological:  Negative for seizures and syncope.  All other systems reviewed and are negative.   Physical Exam Updated Vital Signs BP (!) 122/98 (BP Location: Left Arm)   Pulse (!) 115   Temp 98.1 F (36.7 C) (Oral)   Resp 20   Ht 5\' 3"  (1.6 m)   Wt 130.2 kg   LMP 01/29/2023 (Approximate)   SpO2 97%   BMI 50.84 kg/m  Physical Exam Vitals and nursing note reviewed.  Constitutional:      General: She is not in acute distress.    Appearance: She is well-developed.  HENT:     Head: Normocephalic and atraumatic.  Eyes:     Conjunctiva/sclera: Conjunctivae normal.  Cardiovascular:     Rate and Rhythm: Normal rate and regular rhythm.     Heart sounds: No murmur heard. Pulmonary:  Effort: Pulmonary effort is normal. No respiratory distress.     Breath sounds: Wheezing present.     Comments: Minimal end expiratory wheezing Abdominal:     Palpations: Abdomen is soft.     Tenderness: There is no abdominal tenderness.  Musculoskeletal:        General: No swelling.     Cervical back: Neck supple.  Skin:    General: Skin is warm and dry.     Capillary Refill: Capillary refill takes less than 2 seconds.  Neurological:     Mental Status: She is alert.  Psychiatric:        Mood and Affect: Mood normal.     ED Results / Procedures / Treatments   Labs (all labs  ordered are listed, but only abnormal results are displayed) Labs Reviewed  RESP PANEL BY RT-PCR (RSV, FLU A&B, COVID)  RVPGX2 - Abnormal; Notable for the following components:      Result Value   Influenza A by PCR POSITIVE (*)    All other components within normal limits    EKG EKG Interpretation Date/Time:  Monday February 19 2023 14:20:04 EST Ventricular Rate:  109 PR Interval:  140 QRS Duration:  92 QT Interval:  353 QTC Calculation: 476 R Axis:   47  Text Interpretation: Sinus tachycardia Probable left atrial enlargement Borderline T abnormalities, diffuse leads Baseline wander in lead(s) II Confirmed by Alona Bene [phone]) on 02/19/2023 2:28:19 PM  Radiology DG Chest 2 View Result Date: 02/19/2023 CLINICAL DATA:  Chest pain.  Shortness of breath. EXAM: CHEST - 2 VIEW COMPARISON:  10/13/2009. FINDINGS: There are faint heterogeneous opacities in the bilateral central lungs which causes loss of "Spine sign" on lateral film, concerning for bilateral lower lobe pneumonia. Correlate clinically. Alternatively, findings may be due to overlying soft tissues. Follow-up to clearing is recommended. Bilateral costophrenic angles are clear. Normal cardio-mediastinal silhouette. No acute osseous abnormalities. The soft tissues are within normal limits. IMPRESSION: *There are faint heterogeneous opacities in the bilateral central lungs which causes loss of spine sign on lateral film, concerning for bilateral lower lobe pneumonia. Correlate clinically. Follow-up to clearing is recommended. Electronically Signed   By: Jules Schick M.D.   On: 02/19/2023 15:21    Procedures Procedures    Medications Ordered in ED Medications  albuterol (VENTOLIN HFA) 108 (90 Base) MCG/ACT inhaler 2 puff (has no administration in time range)  ibuprofen (ADVIL) tablet 800 mg (has no administration in time range)  acetaminophen (TYLENOL) tablet 650 mg (has no administration in time range)  oxymetazoline (AFRIN)  0.05 % nasal spray 2 spray (has no administration in time range)  albuterol (VENTOLIN HFA) 108 (90 Base) MCG/ACT inhaler 2 puff (has no administration in time range)  ondansetron (ZOFRAN-ODT) disintegrating tablet 4 mg (has no administration in time range)    ED Course/ Medical Decision Making/ A&P                                 Medical Decision Making Amount and/or Complexity of Data Reviewed Radiology: ordered.  Risk OTC drugs. Prescription drug management.   43 year old female presenting for nasal congestion, sore throat, shortness of breath, coughing, body aches, chills, and nausea.patient is alert and oriented x 3, no acute distress, afebrile, stable vital signs.  Physical exam demonstrates equal bilateral breath sounds with minimal end expiratory wheezing.  No hypoxia.  Breathing has improved after resting.  Patient admits to vaping.  Albuterol inhaler given in ED.  Patient abdomen is soft and nontender.  Admits to nausea.  Zofran given.  Nasal congestion present.  Afrin given.  Motrin and Tylenol given for myalgias and chills.  Otherwise patient is nontoxic-appearing without signs or symptoms of sepsis.  Flu positive.  Safe for return home with recommendations for rest, increase hydration, vitamin C, Tamiflu sent to pharmacy.  Patient in no distress and overall condition improved here in the ED. Detailed discussions were had with the patient regarding current findings, and need for close f/u with PCP or on call doctor. The patient has been instructed to return immediately if the symptoms worsen in any way for re-evaluation. Patient verbalized understanding and is in agreement with current care plan. All questions answered prior to discharge.         Final Clinical Impression(s) / ED Diagnoses Final diagnoses:  Influenza    Rx / DC Orders ED Discharge Orders          Ordered    oseltamivir (TAMIFLU) 75 MG capsule  Every 12 hours        02/19/23 1648               Franne Forts, DO 02/19/23 1650

## 2023-02-19 NOTE — ED Triage Notes (Signed)
Pt POV steady, slow gait.   Pt c/o ShOB, chest pain, cough x 1 day. Denies fever.   Pt tachypneic, mouth breathing, nasal congestion.   RT to triage to assess.

## 2023-02-19 NOTE — Discharge Instructions (Signed)
Today you were diagnosed with influenza.  Rest, increase oral hydration, and increase vitamin C.  Take Motrin 800 mg every 8 hours as needed for body aches or chills.  Take Tylenol 650 mg every 8 hours as needed for body aches or chills.  You can use Afrin nasal spray every 12 hours for 5 days maximum.  You do not want to use this medication after 5 days because it can cause rebound nasal congestion once your symptoms have otherwise resolved.  Prescription for Tamiflu and antiviral medicine for the fluids been sent to your pharmacy.

## 2023-04-19 ENCOUNTER — Other Ambulatory Visit: Payer: Self-pay

## 2023-04-19 ENCOUNTER — Encounter (HOSPITAL_BASED_OUTPATIENT_CLINIC_OR_DEPARTMENT_OTHER): Payer: Self-pay

## 2023-04-19 ENCOUNTER — Emergency Department (HOSPITAL_BASED_OUTPATIENT_CLINIC_OR_DEPARTMENT_OTHER)
Admission: EM | Admit: 2023-04-19 | Discharge: 2023-04-19 | Disposition: A | Discharge status: Home / Self Care | Attending: Emergency Medicine | Admitting: Emergency Medicine

## 2023-04-19 ENCOUNTER — Emergency Department (HOSPITAL_BASED_OUTPATIENT_CLINIC_OR_DEPARTMENT_OTHER)

## 2023-04-19 DIAGNOSIS — D259 Leiomyoma of uterus, unspecified: Secondary | ICD-10-CM | POA: Diagnosis not present

## 2023-04-19 DIAGNOSIS — D219 Benign neoplasm of connective and other soft tissue, unspecified: Secondary | ICD-10-CM

## 2023-04-19 DIAGNOSIS — R102 Pelvic and perineal pain: Secondary | ICD-10-CM | POA: Diagnosis present

## 2023-04-19 HISTORY — DX: Unspecified ovarian cyst, unspecified side: N83.209

## 2023-04-19 LAB — CBC
HCT: 34.1 % — ABNORMAL LOW (ref 36.0–46.0)
Hemoglobin: 10.4 g/dL — ABNORMAL LOW (ref 12.0–15.0)
MCH: 23.7 pg — ABNORMAL LOW (ref 26.0–34.0)
MCHC: 30.5 g/dL (ref 30.0–36.0)
MCV: 77.9 fL — ABNORMAL LOW (ref 80.0–100.0)
Platelets: 392 10*3/uL (ref 150–400)
RBC: 4.38 MIL/uL (ref 3.87–5.11)
RDW: 16.6 % — ABNORMAL HIGH (ref 11.5–15.5)
WBC: 6.3 10*3/uL (ref 4.0–10.5)
nRBC: 0 % (ref 0.0–0.2)

## 2023-04-19 LAB — PREGNANCY, URINE: Preg Test, Ur: NEGATIVE

## 2023-04-19 MED ORDER — HYDROCODONE-ACETAMINOPHEN 5-325 MG PO TABS: 2.0000 | ORAL_TABLET | Freq: Four times a day (QID) | ORAL | 0 refills | Status: AC | PRN

## 2023-04-19 MED ORDER — MORPHINE SULFATE (PF) 4 MG/ML IV SOLN
4.0000 mg | Freq: Once | INTRAVENOUS | Status: AC
  Administered 2023-04-19: 4 mg via INTRAVENOUS
  Filled 2023-04-19: qty 1

## 2023-04-19 MED ORDER — ONDANSETRON HCL 4 MG/2ML IJ SOLN
4.0000 mg | Freq: Once | INTRAMUSCULAR | Status: AC
  Administered 2023-04-19: 4 mg via INTRAVENOUS
  Filled 2023-04-19: qty 2

## 2023-04-19 MED ORDER — HYDROMORPHONE HCL 1 MG/ML IJ SOLN
1.0000 mg | Freq: Once | INTRAMUSCULAR | Status: AC
  Administered 2023-04-19: 1 mg via INTRAVENOUS
  Filled 2023-04-19: qty 1

## 2023-04-19 NOTE — Discharge Instructions (Signed)
 You have been seen and discharged from the emergency department.  Your workup today is significant for fibroids.  You may take ibuprofen for pain control and use stronger pain prescription as needed.  Do not mix this medication with alcohol or other sedating medications. Do not drive or do heavy physical activity until you know how this medication affects you.  It may cause drowsiness.  Establish care with OB/GYN for further evaluation and further treatment. Take home medications as prescribed. If you have any worsening symptoms or further concerns for your health please return to an emergency department for further evaluation.

## 2023-04-19 NOTE — ED Provider Notes (Signed)
 [facility] Provider Note   CSN: [id number] Arrival date & time: 04/19/23  1028     History  Chief Complaint  Patient presents with   Abdominal Pain    Desiree Sweeney is a 43 y.o. female.  HPI   43 year old female presents emergency department with lower pelvic cramping .  Patient states that she started her regular menstrual period 3 days ago on Sunday.  She typically has strong cramps with her menstrual period however over the past couple days these have become more severe.  The amount of bleeding has been baseline for her but her cramps have been more painful.  She is having some back pain as well. She states that she had similar symptoms back in 2015 when she believes she was diagnosed with an ovarian cyst.  Otherwise denies any history of fibroids, endometriosis.  She is not on any blood thinning medicine.  Denies any other vaginal discharge, odor, fever, dysuria.  Home Medications Prior to Admission medications   Medication Sig Start Date End Date Taking? Authorizing Provider  aspirin-sod bicarb-citric acid (ALKA-SELTZER) 325 MG TBEF tablet Take 325 mg by mouth every 6 (six) hours as needed.    [provider]  benzonatate (TESSALON) 100 MG capsule Take 1 capsule (100 mg total) by mouth every 8 (eight) hours. 05/30/21   Renne Crigler, PA-C  cephALEXin (KEFLEX) 500 MG capsule Take 1 capsule (500 mg total) by mouth 4 (four) times daily. 04/06/22   Theron Arista, PA-C  fluticasone (FLONASE) 50 MCG/ACT nasal spray Place 2 sprays into both nostrils daily. 04/15/18   Couture, Cortni S, PA-C  liver oil-zinc oxide (DESITIN) 40 % ointment Apply 1 application topically as needed for irritation. Use after bathing and after wiping/using the bathroom. 11/27/18   Lurene Shadow, PA-C  meloxicam (MOBIC) 7.5 MG tablet Take 1-2 tablets (7.5-15 mg total) by mouth daily as needed for pain. 10/23/18   Petrucelli, Pleas Koch, PA-C  oseltamivir (TAMIFLU)  75 MG capsule Take 1 capsule (75 mg total) by mouth every 12 (twelve) hours. 02/19/23   Franne Forts, DO      Allergies    Patient has no known allergies.    Review of Systems   Review of Systems  Constitutional:  Negative for fever.  Respiratory:  Negative for shortness of breath.   Cardiovascular:  Negative for chest pain.  Gastrointestinal:  Negative for abdominal pain, diarrhea and vomiting.  Genitourinary:  Positive for pelvic pain and vaginal bleeding. Negative for dysuria, flank pain, vaginal discharge and vaginal pain.  Musculoskeletal:  Positive for back pain.  Skin:  Negative for rash.  Neurological:  Negative for headaches.    Physical Exam Updated Vital Signs BP (!) 155/114 (BP Location: Left Arm)   Pulse 86   Temp 98.4 F (36.9 C) (Oral)   Resp 18   Ht 5\' 3"  (1.6 m)   Wt 129.3 kg   LMP 04/15/2023 (Exact Date)   SpO2 98%   BMI 50.49 kg/m  Physical Exam Vitals and nursing note reviewed.  Constitutional:      General: She is not in acute distress.    Appearance: Normal appearance.  HENT:     Head: Normocephalic.     Mouth/Throat:     Mouth: Mucous membranes are moist.  Cardiovascular:     Rate and Rhythm: Normal rate.  Pulmonary:     Effort: Pulmonary effort is normal. No respiratory distress.  Abdominal:  General: Bowel sounds are normal. There is no distension.     Palpations: Abdomen is soft.     Tenderness: There is no abdominal tenderness.  Skin:    General: Skin is warm.  Neurological:     Mental Status: She is alert and oriented to person, place, and time. Mental status is at baseline.  Psychiatric:        Mood and Affect: Mood normal.     ED Results / Procedures / Treatments   Labs (all labs ordered are listed, but only abnormal results are displayed) Labs Reviewed  CBC - Abnormal; Notable for the following components:      Result Value   Hemoglobin 10.4 (*)    HCT 34.1 (*)    MCV 77.9 (*)    MCH 23.7 (*)    RDW 16.6 (*)    All  other components within normal limits  PREGNANCY, URINE    EKG None  Radiology No results found.  Procedures Procedures    Medications Ordered in ED Medications  morphine (PF) 4 MG/ML injection 4 mg (has no administration in time range)    ED Course/ Medical Decision Making/ A&P                                 Medical Decision Making Amount and/or Complexity of Data Reviewed Labs: ordered. Radiology: ordered.  Risk Prescription drug management.   43 year old female presents emergency department on her menstrual period with worsening menstrual cramps and some back pain.  She denies any acute unilateral pelvic pain.  No other vaginal discharge.  Possible history ovarian cyst in the past.  Vitals are normal and stable, abdominal exam is benign.  Pelvic deferred as the patient is currently on her menstrual period without any increase or abnormal amount of bleeding.  Blood work is reassuring, hemoglobin is stable.  Ultrasound reveals multiple fibroids without any other acute findings.  The left ovary is not fully visualized however again without any acute onset of unilateral pelvic pain, and currently only cramping pain which is usually associated with her menstrual period I have low suspicion for torsion at this time.  Pain at this time is improving and she is scheduling outpatient follow-up with her OB/GYN.  Patient at this time appears safe and stable for discharge and close outpatient follow up. Discharge plan and strict return to ED precautions discussed, patient verbalizes understanding and agreement.        Final Clinical Impression(s) / ED Diagnoses Final diagnoses:  None    Rx / DC Orders ED Discharge Orders     None         Rozelle Logan, DO 04/19/23 1526

## 2023-04-19 NOTE — ED Triage Notes (Signed)
 Pt states that she is on her cycle currently and she is having unbearable cramps. States that she had to push out a clot yesterday. Pt states that she is going through 2-3 pads per hour. Denies dizziness. No SOB

## 2024-01-06 ENCOUNTER — Emergency Department (HOSPITAL_BASED_OUTPATIENT_CLINIC_OR_DEPARTMENT_OTHER)
Admission: EM | Admit: 2024-01-06 | Discharge: 2024-01-06 | Disposition: A | Discharge status: Home / Self Care | Attending: Emergency Medicine | Admitting: Emergency Medicine

## 2024-01-06 ENCOUNTER — Encounter (HOSPITAL_BASED_OUTPATIENT_CLINIC_OR_DEPARTMENT_OTHER): Payer: Self-pay | Admitting: Emergency Medicine

## 2024-01-06 DIAGNOSIS — R059 Cough, unspecified: Secondary | ICD-10-CM | POA: Diagnosis present

## 2024-01-06 DIAGNOSIS — U071 COVID-19: Secondary | ICD-10-CM | POA: Diagnosis not present

## 2024-01-06 DIAGNOSIS — Z7982 Long term (current) use of aspirin: Secondary | ICD-10-CM | POA: Diagnosis not present

## 2024-01-06 LAB — RESP PANEL BY RT-PCR (RSV, FLU A&B, COVID)  RVPGX2
Influenza A by PCR: NEGATIVE
Influenza B by PCR: NEGATIVE
Resp Syncytial Virus by PCR: NEGATIVE
SARS Coronavirus 2 by RT PCR: POSITIVE — AB

## 2024-01-06 NOTE — ED Triage Notes (Signed)
 Pt sts my entire house has Covid; she c/o body aches, fatigue, HA

## 2024-01-06 NOTE — Discharge Instructions (Signed)
 Take Motrin  (ibuprofen ) 600 to 800 mg every 8 hours as needed for body aches, fevers, chills, headaches Take Tylenol  650 mg every 8 hours as needed for body aches, fevers, chills, headaches Use Afrin nasal spray every 12 hours as needed for nasal congestion.  Do not use after 7 days due to rebound congestion side effects Rest, drink vitamin C, take zinc  Return to work when without a fever for 24 hours

## 2024-01-06 NOTE — ED Provider Notes (Incomplete)
 [facility] Provider Note   CSN: [id number] Arrival date & time: 01/06/24  1600     Patient presents with: Generalized Body Aches   Desiree Sweeney is a 43 y.o. female.  {Add pertinent medical, surgical, social history, OB history to YEP:67052} Patient is a 43 year old female presenting for congestion, rhinorrhea, sore throat, cough, body aches, and chills.  Family all COVID-positive at the house.  No neck stiffness or neck rigidity.  No difficulty breathing.  The history is provided by the patient. No language interpreter was used.       Prior to Admission medications  Medication Sig Start Date End Date Taking? Authorizing Provider  aspirin-sod bicarb-citric acid (ALKA-SELTZER) 325 MG TBEF tablet Take 325 mg by mouth every 6 (six) hours as needed.    [provider]  benzonatate  (TESSALON ) 100 MG capsule Take 1 capsule (100 mg total) by mouth every 8 (eight) hours. 05/30/21   Geiple, Joshua, PA-C  cephALEXin  (KEFLEX ) 500 MG capsule Take 1 capsule (500 mg total) by mouth 4 (four) times daily. 04/06/22   Emelia Sluder, PA-C  fluticasone  (FLONASE ) 50 MCG/ACT nasal spray Place 2 sprays into both nostrils daily. 04/15/18   Couture, Cortni S, PA-C  HYDROcodone -acetaminophen  (NORCO/VICODIN) 5-325 MG tablet Take 2 tablets by mouth every 6 (six) hours as needed. 04/19/23   Horton, Kristie M, DO  liver oil-zinc  oxide (DESITIN) 40 % ointment Apply 1 application topically as needed for irritation. Use after bathing and after wiping/using the bathroom. 11/27/18   Anitra Rocky KIDD, PA-C  meloxicam  (MOBIC ) 7.5 MG tablet Take 1-2 tablets (7.5-15 mg total) by mouth daily as needed for pain. 10/23/18   Petrucelli, Samantha R, PA-C  oseltamivir  (TAMIFLU ) 75 MG capsule Take 1 capsule (75 mg total) by mouth every 12 (twelve) hours. 02/19/23   Elnor Bernarda SQUIBB, DO    Allergies: Patient has no known allergies.    Review of Systems  Constitutional:  Positive for  chills, fatigue and fever.  HENT:  Positive for congestion, rhinorrhea and sore throat. Negative for ear pain.   Eyes:  Negative for pain and visual disturbance.  Respiratory:  Positive for shortness of breath. Negative for cough.   Cardiovascular:  Negative for chest pain and palpitations.  Gastrointestinal:  Negative for abdominal pain and vomiting.  Genitourinary:  Negative for dysuria and hematuria.  Musculoskeletal:  Positive for myalgias. Negative for arthralgias and back pain.  Skin:  Negative for color change and rash.  Neurological:  Negative for seizures and syncope.  All other systems reviewed and are negative.   Updated Vital Signs BP (!) 141/97 (BP Location: Right Arm)   Pulse 97   Temp 98.7 F (37.1 C) (Oral)   Resp 18   SpO2 96%   Physical Exam Vitals and nursing note reviewed.  Constitutional:      General: She is not in acute distress.    Appearance: She is well-developed.  HENT:     Head: Normocephalic and atraumatic.  Eyes:     Conjunctiva/sclera: Conjunctivae normal.  Cardiovascular:     Rate and Rhythm: Normal rate and regular rhythm.     Heart sounds: No murmur heard. Pulmonary:     Effort: Pulmonary effort is normal. No respiratory distress.     Breath sounds: Normal breath sounds.  Abdominal:     Palpations: Abdomen is soft.     Tenderness: There is no abdominal tenderness.  Musculoskeletal:        General: No  swelling.     Cervical back: Neck supple.  Skin:    General: Skin is warm and dry.     Capillary Refill: Capillary refill takes less than 2 seconds.  Neurological:     Mental Status: She is alert.  Psychiatric:        Mood and Affect: Mood normal.     (all labs ordered are listed, but only abnormal results are displayed) Labs Reviewed  RESP PANEL BY RT-PCR (RSV, FLU A&B, COVID)  RVPGX2 - Abnormal; Notable for the following components:      Result Value   SARS Coronavirus 2 by RT PCR POSITIVE (*)    All other components within  normal limits    EKG: None  Radiology: No results found.  {Document cardiac monitor, telemetry assessment procedure when appropriate:32947} Procedures   Medications Ordered in the ED - No data to display    {Click here for ABCD2, HEART and other calculators REFRESH Note before signing:1}                              Medical Decision Making   43 year old female presenting for congestion, rhinorrhea, sore throat, cough, body aches, and chills.   {Document critical care time when appropriate  Document review of labs and clinical decision tools ie [id number], etc  Document your independent review of radiology images and any outside records  Document your discussion with family members, caretakers and with consultants  Document social determinants of health affecting pt's care  Document your decision making why or why not admission, treatments were needed:32947:::1}   Final diagnoses:  None    ED Discharge Orders     None
